# Patient Record
Sex: Female | Born: 1970 | Race: White | Hispanic: No | Marital: Married | Smoking: Never smoker
Health system: Southern US, Community
[De-identification: ages and names within clinical notes are randomized; demographics above are authoritative.]

## PROBLEM LIST (undated history)

## (undated) DIAGNOSIS — Z803 Family history of malignant neoplasm of breast: Secondary | ICD-10-CM

## (undated) DIAGNOSIS — J9801 Acute bronchospasm: Secondary | ICD-10-CM

## (undated) DIAGNOSIS — M1990 Unspecified osteoarthritis, unspecified site: Secondary | ICD-10-CM

## (undated) DIAGNOSIS — F419 Anxiety disorder, unspecified: Secondary | ICD-10-CM

## (undated) DIAGNOSIS — Z8041 Family history of malignant neoplasm of ovary: Secondary | ICD-10-CM

## (undated) DIAGNOSIS — R112 Nausea with vomiting, unspecified: Secondary | ICD-10-CM

## (undated) DIAGNOSIS — G4700 Insomnia, unspecified: Secondary | ICD-10-CM

## (undated) DIAGNOSIS — R87619 Unspecified abnormal cytological findings in specimens from cervix uteri: Secondary | ICD-10-CM

## (undated) DIAGNOSIS — G2581 Restless legs syndrome: Secondary | ICD-10-CM

## (undated) DIAGNOSIS — Z8481 Family history of carrier of genetic disease: Secondary | ICD-10-CM

## (undated) DIAGNOSIS — G4733 Obstructive sleep apnea (adult) (pediatric): Secondary | ICD-10-CM

## (undated) DIAGNOSIS — J45909 Unspecified asthma, uncomplicated: Secondary | ICD-10-CM

## (undated) HISTORY — DX: Unspecified osteoarthritis, unspecified site: M19.90

## (undated) HISTORY — DX: Family history of malignant neoplasm of breast: Z80.3

## (undated) HISTORY — DX: Family history of carrier of genetic disease: Z84.81

## (undated) HISTORY — DX: Anxiety disorder, unspecified: F41.9

## (undated) HISTORY — PX: WISDOM TOOTH EXTRACTION: SHX21

## (undated) HISTORY — DX: Unspecified abnormal cytological findings in specimens from cervix uteri: R87.619

## (undated) HISTORY — DX: Family history of malignant neoplasm of ovary: Z80.41

## (undated) HISTORY — DX: Acute bronchospasm: J98.01

---
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
## 2012-06-10 ENCOUNTER — Other Ambulatory Visit: Payer: Self-pay | Admitting: Family Medicine

## 2012-06-10 DIAGNOSIS — Z1231 Encounter for screening mammogram for malignant neoplasm of breast: Secondary | ICD-10-CM

## 2012-07-07 ENCOUNTER — Ambulatory Visit
Admission: RE | Admit: 2012-07-07 | Discharge: 2012-07-07 | Disposition: A | Discharge status: Home / Self Care | Payer: BC Managed Care – PPO | Source: Ambulatory Visit | Attending: Family Medicine | Admitting: Family Medicine

## 2012-07-07 DIAGNOSIS — Z1231 Encounter for screening mammogram for malignant neoplasm of breast: Secondary | ICD-10-CM

## 2012-07-21 ENCOUNTER — Other Ambulatory Visit (HOSPITAL_COMMUNITY)
Admission: RE | Admit: 2012-07-21 | Discharge: 2012-07-21 | Disposition: A | Discharge status: Home / Self Care | Payer: BC Managed Care – PPO | Source: Ambulatory Visit | Attending: Family Medicine | Admitting: Family Medicine

## 2012-07-21 ENCOUNTER — Other Ambulatory Visit: Payer: Self-pay | Admitting: Family Medicine

## 2012-07-21 DIAGNOSIS — Z124 Encounter for screening for malignant neoplasm of cervix: Secondary | ICD-10-CM | POA: Insufficient documentation

## 2012-07-21 DIAGNOSIS — Z1151 Encounter for screening for human papillomavirus (HPV): Secondary | ICD-10-CM | POA: Insufficient documentation

## 2013-06-03 ENCOUNTER — Other Ambulatory Visit: Payer: Self-pay

## 2013-06-03 DIAGNOSIS — Z1231 Encounter for screening mammogram for malignant neoplasm of breast: Secondary | ICD-10-CM

## 2013-07-09 ENCOUNTER — Encounter: Payer: Self-pay | Admitting: *Deleted

## 2013-07-10 ENCOUNTER — Ambulatory Visit (INDEPENDENT_AMBULATORY_CARE_PROVIDER_SITE_OTHER): Payer: 59 | Admitting: Gynecology

## 2013-07-10 ENCOUNTER — Ambulatory Visit: Payer: BC Managed Care – PPO

## 2013-07-10 ENCOUNTER — Encounter: Payer: Self-pay | Admitting: Gynecology

## 2013-07-10 VITALS — BP 96/54 | HR 64 | Resp 12 | Ht 67.75 in | Wt 142.2 lb

## 2013-07-10 DIAGNOSIS — F5101 Primary insomnia: Secondary | ICD-10-CM | POA: Insufficient documentation

## 2013-07-10 DIAGNOSIS — G4700 Insomnia, unspecified: Secondary | ICD-10-CM

## 2013-07-10 DIAGNOSIS — Z01419 Encounter for gynecological examination (general) (routine) without abnormal findings: Secondary | ICD-10-CM

## 2013-07-10 NOTE — Progress Notes (Signed)
42 y.o. married Caucasian female   G0P0 here for annual exam. Pt is currently sexually active.    Patient's last menstrual period was 07/01/2013.          Sexually active: yes  The current method of family planning is vasectomy.    Exercising: yes  swim, cycle and run Last pap: 05/2012 normal, neg HRHPV Alcohol: 1x weekly  Tobacco: no BSE: sometimes Mammogram: due 8/7    Health Maintenance  Topic Date Due  . Tetanus/tdap  11/20/1990  . Influenza Vaccine  08/24/2013  . Pap Smear  07/22/2015    Family History  Problem Relation Age of Onset  . Breast cancer Maternal Grandmother   . Cancer Maternal Grandfather     lung cancer    There are no active problems to display for this patient.   Past Medical History  Diagnosis Date  . Anxiety     Past Surgical History  Procedure Laterality Date  . Wisdom tooth extraction      Allergies: Review of patient's allergies indicates no known allergies.  Current Outpatient Prescriptions  Medication Sig Dispense Refill  . clonazePAM (KLONOPIN) 2 MG tablet Take 2 mg by mouth 2 (two) times daily as needed for anxiety.       No current facility-administered medications for this visit.    ROS: Pertinent items are noted in HPI.  Exam:    BP 96/54  Pulse 64  Resp 12  Ht 5' 7.75" (1.721 m)  Wt 142 lb 3.2 oz (64.501 kg)  BMI 21.78 kg/m2  LMP 07/01/2013 Weight change: @WEIGHTCHANGE @ Last 3 height recordings:  Ht Readings from Last 3 Encounters:  07/10/13 5' 7.75" (1.721 m)   General appearance: alert, cooperative and appears stated age Head: Normocephalic, without obvious abnormality, atraumatic Neck: no adenopathy, no carotid bruit, no JVD, supple, symmetrical, trachea midline and thyroid not enlarged, symmetric, no tenderness/mass/nodules Lungs: clear to auscultation bilaterally Breasts: normal appearance, no masses or tenderness Heart: regular rate and rhythm, S1, S2 normal, no murmur, click, rub or gallop Abdomen: soft,  non-tender; bowel sounds normal; no masses,  no organomegaly Extremities: extremities normal, atraumatic, no cyanosis or edema Skin: Skin color, texture, turgor normal. No rashes or lesions Lymph nodes: Cervical, supraclavicular, and axillary nodes normal. no inguinal nodes palpated Neurologic: Grossly normal   Pelvic: External genitalia:  no lesions              Urethra: normal appearing urethra with no masses, tenderness or lesions              Bartholins and Skenes: normal                 Vagina: normal appearing vagina with normal color and discharge, no lesions              Cervix: normal appearance              Pap taken: no        Bimanual Exam:  Uterus:  uterus is normal size, shape, consistency and nontender                                      Adnexa:    normal adnexa in size, nontender and no masses  Rectovaginal: Confirms                                      Anus:  normal sphincter tone, no lesions  A: well woman     P: mammogram due pap smear guidelines reviewed, due 2018 return annually or prn   An After Visit Summary was printed and given to the patient.

## 2013-07-10 NOTE — Patient Instructions (Signed)

## 2013-07-30 ENCOUNTER — Ambulatory Visit
Admission: RE | Admit: 2013-07-30 | Discharge: 2013-07-30 | Disposition: A | Discharge status: Home / Self Care | Payer: 59 | Source: Ambulatory Visit

## 2013-07-30 DIAGNOSIS — Z1231 Encounter for screening mammogram for malignant neoplasm of breast: Secondary | ICD-10-CM

## 2014-04-19 ENCOUNTER — Other Ambulatory Visit: Payer: Self-pay

## 2014-04-19 DIAGNOSIS — Z1231 Encounter for screening mammogram for malignant neoplasm of breast: Secondary | ICD-10-CM

## 2014-07-16 ENCOUNTER — Encounter: Payer: Self-pay | Admitting: Gynecology

## 2014-07-16 ENCOUNTER — Ambulatory Visit (INDEPENDENT_AMBULATORY_CARE_PROVIDER_SITE_OTHER): Payer: 59 | Admitting: Gynecology

## 2014-07-16 VITALS — BP 102/60 | HR 70 | Resp 16 | Ht 68.0 in | Wt 141.0 lb

## 2014-07-16 DIAGNOSIS — Z01419 Encounter for gynecological examination (general) (routine) without abnormal findings: Secondary | ICD-10-CM

## 2014-07-16 DIAGNOSIS — Z3041 Encounter for surveillance of contraceptive pills: Secondary | ICD-10-CM

## 2014-07-16 DIAGNOSIS — Z113 Encounter for screening for infections with a predominantly sexual mode of transmission: Secondary | ICD-10-CM

## 2014-07-16 MED ORDER — ETONOGESTREL-ETHINYL ESTRADIOL 0.12-0.015 MG/24HR VA RING: VAGINAL_RING | VAGINAL | Status: DC

## 2014-07-16 NOTE — Progress Notes (Signed)
43 y.o. Separated Caucasian female   G0P0 here for annual exam. Pt is currently sexually active.  2 recent partners since separation, +/- condom use, interested in STD screening.  Pt happy with separation.  Does several tri-athelon's a year.  Patient's last menstrual period was 06/10/2014.          Sexually active: Yes.    The current method of family planning is OCP (estrogen/progesterone).    Exercising: Yes.    Cycling 2/wk, running 1x/wk, swimming 1x/wkk Last pap: 06/23/12 NEG HR HPV Alcohol: Maybe 1 drink/wk Tobacco: no BSE:  No Mammogram: 07/30/13 Bi-Rads 1   Labs: Satira Mccallum    Health Maintenance  Topic Date Due  . Tetanus/tdap  11/20/1990  . Influenza Vaccine  07/24/2014  . Pap Smear  07/26/2015    Family History  Problem Relation Age of Onset  . Breast cancer Maternal Grandmother   . Cancer Maternal Grandfather     lung cancer    Patient Active Problem List   Diagnosis Date Noted  . Insomnia 07/10/2013    Past Medical History  Diagnosis Date  . Anxiety     Past Surgical History  Procedure Laterality Date  . Wisdom tooth extraction      Allergies: Review of patient's allergies indicates no known allergies.  Current Outpatient Prescriptions  Medication Sig Dispense Refill  . clonazePAM (KLONOPIN) 2 MG tablet Take 2 mg by mouth 2 (two) times daily as needed for anxiety.      [employer] Mast Triphasic (ORTHO-NOVUM 7/7/7, 28, PO) Take by mouth.       No current facility-administered medications for this visit.    ROS: Pertinent items are noted in HPI.  Exam:    BP 102/60  Pulse 70  Resp 16  Ht 5\' 8"  (1.727 m)  Wt 141 lb (63.957 kg)  BMI 21.44 kg/m2  LMP 06/10/2014 Weight change: @WEIGHTCHANGE @ Last 3 height recordings:  Ht Readings from Last 3 Encounters:  07/16/14 5\' 8"  (1.727 m)  07/10/13 5' 7.75" (1.721 m)   General appearance: alert, cooperative and appears stated age Head: Normocephalic, without obvious abnormality,  atraumatic Neck: no adenopathy, no carotid bruit, no JVD, supple, symmetrical, trachea midline and thyroid not enlarged, symmetric, no tenderness/mass/nodules Lungs: clear to auscultation bilaterally Breasts: normal appearance, no masses or tenderness Heart: regular rate and rhythm, S1, S2 normal, no murmur, click, rub or gallop Abdomen: soft, non-tender; bowel sounds normal; no masses,  no organomegaly Extremities: extremities normal, atraumatic, no cyanosis or edema Skin: Skin color, texture, turgor normal. No rashes or lesions Lymph nodes: Cervical, supraclavicular, and axillary nodes normal. no inguinal nodes palpated Neurologic: Grossly normal   Pelvic: External genitalia:  normal escutcheon              Urethra: normal appearing urethra with no masses, tenderness or lesions              Bartholins and Skenes: Bartholin's, Urethra, Skene's normal                 Vagina: normal appearing vagina with normal color and discharge, no lesions              Cervix: normal appearance              Pap taken: No.        Bimanual Exam:  Uterus:  uterus is normal size, shape, consistency and nontender  Adnexa:    normal adnexa in size, nontender and no masses                                      Rectovaginal: Confirms                                      Anus:  normal sphincter tone, no lesions     1. Routine gynecological examination  counseled on breast self exam, mammography screening, use and side effects of OCP's, adequate intake of calcium and vitamin D, diet and exercise, condoms stressed return annually or prn  2. Screen for STD (sexually transmitted disease)  - N. gonorrhoea and Chlamydia by PCR (IPS) - STD Panel (HBSAG,HIV,RPR)  3. Encounter for surveillance of contraceptive pills  - etonogestrel-ethinyl estradiol (NUVARING) 0.12-0.015 MG/24HR vaginal ring; Insert vaginally and leave in place for 4 consecutive weeks, then remove for 3d   Dispense: 3 each; Refill: 3  An After Visit Summary was printed and given to the patient.

## 2014-07-16 NOTE — Patient Instructions (Signed)

## 2014-07-17 LAB — STD PANEL
HIV: NONREACTIVE
Hepatitis B Surface Ag: NEGATIVE

## 2014-07-20 LAB — IPS N GONORRHOEA AND CHLAMYDIA BY PCR

## 2014-08-02 ENCOUNTER — Ambulatory Visit
Admission: RE | Admit: 2014-08-02 | Discharge: 2014-08-02 | Disposition: A | Discharge status: Home / Self Care | Payer: 59 | Source: Ambulatory Visit

## 2014-08-02 DIAGNOSIS — Z1231 Encounter for screening mammogram for malignant neoplasm of breast: Secondary | ICD-10-CM

## 2014-08-10 ENCOUNTER — Telehealth: Payer: Self-pay | Admitting: Gynecology

## 2014-08-10 NOTE — Telephone Encounter (Signed)
Patient calling to speak with nurse about Nuvaring questions.

## 2014-08-10 NOTE — Telephone Encounter (Signed)
Spoke with patient. Patient states that Saturday morning she went for a long bike ride and when she returned she began to have vaginal discharge and itching. Patient thought it was from riding her bike so long but symptoms have not gone away. Patient states that she has a vaginal odor as well. Patient read that this could be a side effect of the Nuvaring. Patient received a text from recent partner who has a rash on his thighs and testicles. Patient would like to know if this could be related. Advised patient that he will need to be seen with his doctor to be checked out for rash. Advised does not sound related but can not be sure. Advised best for patient to come in to see provider for evaluation to see what needs to be done for patient. Patient agreeable. Agreeable to see another provider as Dr.Lathrop does not have any openings for this week.Appointment scheduled for tomorrow at 9:15am with Verner Chol CNM. Patient agreeable to date and time.   Cc: Verner Chol CNM   Routing to provider for final review. Patient agreeable to disposition. Will close encounter

## 2014-08-11 ENCOUNTER — Ambulatory Visit (INDEPENDENT_AMBULATORY_CARE_PROVIDER_SITE_OTHER): Payer: 59 | Admitting: Certified Nurse Midwife

## 2014-08-11 ENCOUNTER — Encounter: Payer: Self-pay | Admitting: Certified Nurse Midwife

## 2014-08-11 VITALS — BP 110/70 | HR 68 | Resp 16 | Ht 68.0 in | Wt 141.0 lb

## 2014-08-11 DIAGNOSIS — B3731 Acute candidiasis of vulva and vagina: Secondary | ICD-10-CM

## 2014-08-11 DIAGNOSIS — B373 Candidiasis of vulva and vagina: Secondary | ICD-10-CM

## 2014-08-11 MED ORDER — TERCONAZOLE 0.4 % VA CREA: 1.0000 | TOPICAL_CREAM | Freq: Every day | VAGINAL | Status: DC

## 2014-08-11 NOTE — Progress Notes (Signed)
43 y.o. separated white female g0p0 here with complaint of vaginal symptoms of itching, burning, and increase discharge. Describes discharge as white thick discharge. Onset of symptoms 5 days ago. 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 Partner diagnosed with yeast infection external(female). Contraception Nuvaring. No STD concerns. Urinary symptoms none. Marland Kitchen   O:Healthy female WDWN Affect: normal, orientation x 3  Exam: Abdomen:soft, non tender Lymph node: no enlargement or tenderness Pelvic exam: External genital:  BUS: negative Vagina: white thick no odorous discharge noted. Ph:4.0   ,Wet prep taken Cervix: normal, non tender Uterus: normal, non tender Adnexa:normal, non tender, no masses or fullness noted   Wet Prep results:positive for yeast   A:Yeast Vaginitis   P:Discussed findings of Yeast vaginitis and etiology. Discussed Aveeno or baking soda sitz bath for comfort. Avoid moist clothes or pads for extended period of time. If working out in gym clothes or swim suits for long periods of time change underwear or bottoms of swimsuit if possible. Olive Oil use for skin protection prior to activity can be used to external skin. Rx: Terazol 7 with instructions  Rv prn

## 2014-08-11 NOTE — Progress Notes (Signed)
Reviewed personally.  M. Suzanne Tirrell Buchberger, MD.  

## 2014-08-11 NOTE — Patient Instructions (Signed)

## 2014-10-27 ENCOUNTER — Telehealth: Payer: Self-pay | Admitting: Certified Nurse Midwife

## 2014-10-27 ENCOUNTER — Encounter: Payer: Self-pay | Admitting: Certified Nurse Midwife

## 2014-10-27 ENCOUNTER — Ambulatory Visit (INDEPENDENT_AMBULATORY_CARE_PROVIDER_SITE_OTHER): Payer: 59 | Admitting: Certified Nurse Midwife

## 2014-10-27 VITALS — BP 110/68 | HR 70 | Resp 16 | Ht 68.0 in | Wt 146.0 lb

## 2014-10-27 DIAGNOSIS — B373 Candidiasis of vulva and vagina: Secondary | ICD-10-CM

## 2014-10-27 DIAGNOSIS — B3731 Acute candidiasis of vulva and vagina: Secondary | ICD-10-CM

## 2014-10-27 MED ORDER — NYSTATIN 100000 UNIT/GM EX CREA: 1.0000 "application " | TOPICAL_CREAM | Freq: Two times a day (BID) | CUTANEOUS | Status: DC

## 2014-10-27 NOTE — Patient Instructions (Signed)

## 2014-10-27 NOTE — Progress Notes (Signed)
Reviewed personally.  M. Suzanne Anyelo Mccue, MD.  

## 2014-10-27 NOTE — Telephone Encounter (Signed)
Patient calling because she forgot the name of the OTC medication Debbi told her to buy to mix with the RX prescribed today.

## 2014-10-27 NOTE — Telephone Encounter (Signed)
Spoke with patient. Advised patient that I spoke with Verner Chol CNM and she recommends that patient mix 0.5 % hydrocortisone cream with her prescription for mycostatin. Patient is agreeable and verbalizes understanding.  Routing to provider for final review. Patient agreeable to disposition. Will close encounter

## 2014-10-27 NOTE — Progress Notes (Signed)
42 y.o.separated white female g0p0 here with complaint of vaginal symptoms of external itching, no change in vaginal discharge. Patient is a runner and cyclist, but none excessive recently. .Onset of symptoms 2 weeks ago. Denies new personal products or vaginal dryness. Also noted Nuvaring felt like it was not in place. No STD concerns. Urinary symptoms none. Marland Kitchen   O:Healthy female WDWN Affect: normal, orientation x 3  Exam: Abdomen:soft non tender Lymph node: no enlargement or tenderness Pelvic exam: External genital: red, slightly tender, white exudate present, no lesions wet prep taken BUS: negative Vagina: normal discharge noted. Ph:4.0   ,Wet prep taken, Nuvaring noted in place, with cervix posterior to Nuvaring, Patient shown vagina area and palpated cervix and felt reassured Cervix: normal, non tender Uterus: normal, non tender Adnexa:normal, non tender, no masses or fullness noted   Wet Prep results: Yeast external only   A:Yeast vulvitis   P:Discussed findings of Yeast Vulvitis and etiology. Discussed Aveeno or baking soda sitz bath for comfort. Avoid moist clothes or pads for extended period of time. If working out in gym clothes  for long periods of time change underwear or bottoms of work out clothes if become very moist to help prevent reoccurrence. Olive Oil or coconut oil  use for skin protection prior to activity can be used to external skin. Questions addressed.  Rx: Nystatin cream see order with instructions Obtain OTC 1/2 % hydrocortisone cream to use with also for 5 days bid.  Rv prn

## 2014-11-10 ENCOUNTER — Telehealth: Payer: Self-pay | Admitting: Certified Nurse Midwife

## 2014-11-10 NOTE — Telephone Encounter (Signed)
Spoke with patient. Patient states that she was seen with Verner Chol CNM and treated for a yeast infection. Patient was treated with nystatin cream which she mixed with hydrocortisone cream for 7 days. States that symptoms went away until she ran for 2 days and cycled for 1. 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 Patient used nystatin with hydrocortisone cream for one day and coconut oil for one day. "I just want to make sure that is okay." Advised patient this is fine to use with symptoms. Advised if this does not relieve symptoms will need to be seen for OV. Patient states that this provided her relief. Patient would like to know if she can spread what she is experiencing during intercourse. Advised patient if she is just having vaginal redness and external itching I would not be as concerned about it spreading. Advised it may cause more irritation to the area. Advised if she has any vaginal discharge or internal irritation it is best to abstain from intercourse. Advised best to abstain until symptoms have cleared. Patient is agreeable. Advised would send a message to Verner Chol CNM and if she has any further recommendations will return call.  Verner Chol CNM anything further for patient?

## 2014-11-10 NOTE — Telephone Encounter (Signed)
Agree with plan 

## 2014-11-10 NOTE — Telephone Encounter (Signed)
Patient was told to call if the medication she was prescribed recently did not work for her.

## 2014-11-24 ENCOUNTER — Telehealth: Payer: Self-pay | Admitting: Gynecology

## 2014-11-24 ENCOUNTER — Other Ambulatory Visit: Payer: Self-pay

## 2014-11-24 DIAGNOSIS — Z1231 Encounter for screening mammogram for malignant neoplasm of breast: Secondary | ICD-10-CM

## 2014-11-24 NOTE — Telephone Encounter (Signed)
Left message to reschedule aex appointment. °

## 2015-04-26 ENCOUNTER — Other Ambulatory Visit: Payer: Self-pay | Admitting: Certified Nurse Midwife

## 2015-04-26 DIAGNOSIS — Z3041 Encounter for surveillance of contraceptive pills: Secondary | ICD-10-CM

## 2015-04-26 MED ORDER — ETONOGESTREL-ETHINYL ESTRADIOL 0.12-0.015 MG/24HR VA RING: VAGINAL_RING | VAGINAL | Status: DC

## 2015-04-26 NOTE — Telephone Encounter (Signed)
Patient is requesting a refill of birth control, confirmed pharmacy on file.  °

## 2015-04-26 NOTE — Telephone Encounter (Signed)
Medication refill request: Nuvaring  Last AEX:  07/16/14 with TL  Next AEX: 08/04/15 with DL  Last MMG (if hormonal medication request): 08/02/14 Bi-Rads 1: Negative  Refill authorized: #3 ring/0 rfs, please advise  (sent to [facility] since DL is out of office today)

## 2015-04-27 NOTE — Telephone Encounter (Signed)
Patient notified that rx was sent to pharmacy  ?

## 2015-07-18 ENCOUNTER — Ambulatory Visit: Payer: 59 | Admitting: Gynecology

## 2015-07-25 DIAGNOSIS — Z8742 Personal history of other diseases of the female genital tract: Secondary | ICD-10-CM

## 2015-07-25 HISTORY — DX: Personal history of other diseases of the female genital tract: Z87.42

## 2015-07-26 ENCOUNTER — Other Ambulatory Visit: Payer: Self-pay | Admitting: *Deleted

## 2015-07-26 DIAGNOSIS — Z3041 Encounter for surveillance of contraceptive pills: Secondary | ICD-10-CM

## 2015-07-26 MED ORDER — ETONOGESTREL-ETHINYL ESTRADIOL 0.12-0.015 MG/24HR VA RING: VAGINAL_RING | VAGINAL | Status: DC

## 2015-07-26 NOTE — Telephone Encounter (Signed)
Medication refill request: Nuvaring  Last AEX:  07-16-14  Next AEX: 08-04-15 Last MMG (if hormonal medication request): 08-02-14 WNL  Refill authorized: please advise -eh

## 2015-08-04 ENCOUNTER — Encounter: Payer: Self-pay | Admitting: Certified Nurse Midwife

## 2015-08-04 ENCOUNTER — Ambulatory Visit (INDEPENDENT_AMBULATORY_CARE_PROVIDER_SITE_OTHER): Payer: 59 | Admitting: Certified Nurse Midwife

## 2015-08-04 ENCOUNTER — Ambulatory Visit
Admission: RE | Admit: 2015-08-04 | Discharge: 2015-08-04 | Disposition: A | Discharge status: Home / Self Care | Payer: 59 | Source: Ambulatory Visit

## 2015-08-04 VITALS — BP 102/70 | HR 80 | Resp 16 | Ht 67.0 in | Wt 147.0 lb

## 2015-08-04 DIAGNOSIS — Z1231 Encounter for screening mammogram for malignant neoplasm of breast: Secondary | ICD-10-CM

## 2015-08-04 DIAGNOSIS — Z124 Encounter for screening for malignant neoplasm of cervix: Secondary | ICD-10-CM

## 2015-08-04 DIAGNOSIS — N898 Other specified noninflammatory disorders of vagina: Secondary | ICD-10-CM | POA: Diagnosis not present

## 2015-08-04 DIAGNOSIS — Z3041 Encounter for surveillance of contraceptive pills: Secondary | ICD-10-CM

## 2015-08-04 DIAGNOSIS — Z01419 Encounter for gynecological examination (general) (routine) without abnormal findings: Secondary | ICD-10-CM

## 2015-08-04 LAB — WET PREP BY MOLECULAR PROBE
CANDIDA SPECIES: NEGATIVE
Gardnerella vaginalis: NEGATIVE
Trichomonas vaginosis: NEGATIVE

## 2015-08-04 MED ORDER — ETONOGESTREL-ETHINYL ESTRADIOL 0.12-0.015 MG/24HR VA RING: VAGINAL_RING | VAGINAL | Status: DC

## 2015-08-04 NOTE — Progress Notes (Signed)
44 y.o. G0P0 Legally Separated  Caucasian Fe here for annual exam. Patient still working out with swimming, biking,running and weights. Using Nystatin cream prn for itching and yeast, working well. Sexually active, no concerns or STD screening. Completed an iron triathlon this year! Sees PCP aex/labs and medication management. Feels like she is has vaginal irritation again, but no itching or burning or increase in discharge. Feels same as before. No other health concerns today.  Patient's last menstrual period was 07/22/2015.          Sexually active: Yes.    The current method of family planning is NuvaRing vaginal inserts.    Exercising: Yes.    Swim, Bike, run, [employer] Smoker:  no  Health Maintenance: Pap:  06/2012 Neg. HR HPV:Neg MMG:  08/12/14 BIRADS1:neg. Had one today, results pending Self Breast Exam: No Colonoscopy:  Never BMD:   Never TDaP:  < 10 years  Labs: PCP   reports that she has never smoked. She has never used smokeless tobacco. She reports that she drinks about 2.4 oz of alcohol per week. She reports that she does not use illicit drugs.  Past Medical History  Diagnosis Date  . Anxiety     Past Surgical History  Procedure Laterality Date  . Wisdom tooth extraction      Current Outpatient Prescriptions  Medication Sig Dispense Refill  . clonazePAM (KLONOPIN) 2 MG tablet Take 2 mg by mouth 2 (two) times daily as needed for anxiety.    Marland Kitchen etonogestrel-ethinyl estradiol (NUVARING) 0.12-0.015 MG/24HR vaginal ring Insert vaginally and leave in place for 4 consecutive weeks, then remove for 3d 1 each 0  . nystatin cream (MYCOSTATIN) Apply 1 application topically 2 (two) times daily. Apply to affected area BID for up to 7 days. 30 g 0   No current facility-administered medications for this visit.    Family History  Problem Relation Age of Onset  . Breast cancer Maternal Grandmother   . Cancer Maternal Grandfather     lung cancer    ROS:  Pertinent items are noted  in HPI.  Otherwise, a comprehensive ROS was negative.  Exam:   BP 102/70 mmHg  Pulse 80  Resp 16  Ht 5\' 7"  (1.702 m)  Wt 147 lb (66.679 kg)  BMI 23.02 kg/m2  LMP 07/22/2015 Height: 5\' 7"  (170.2 cm) Ht Readings from Last 3 Encounters:  08/04/15 5\' 7"  (1.702 m)  10/27/14 5\' 8"  (1.727 m)  08/11/14 5\' 8"  (1.727 m)    General appearance: alert, cooperative and appears stated age Head: Normocephalic, without obvious abnormality, atraumatic Neck: no adenopathy, supple, symmetrical, trachea midline and thyroid normal to inspection and palpation Lungs: clear to auscultation bilaterally Breasts: normal appearance, no masses or tenderness, No nipple retraction or dimpling, No nipple discharge or bleeding, No axillary or supraclavicular adenopathy Heart: regular rate and rhythm Abdomen: soft, non-tender; no masses,  no organomegaly Extremities: extremities normal, atraumatic, no cyanosis or edema Skin: Skin color, texture, turgor normal. No rashes or lesions Lymph nodes: Cervical, supraclavicular, and axillary nodes normal. No abnormal inguinal nodes palpated Neurologic: Grossly normal   Pelvic: External genitalia:  no lesions              Urethra:  normal appearing urethra with no masses, tenderness or lesions              Bartholin's and Skene's: normal                 Vagina: normal appearing vagina  with normal color and discharge, no lesions, small ? Abrasion at fourchette with slight redness and some redness around fourchette area, no exudate, slightly tender to touch  Affirm taken              Cervix: normal, non tender,no lesions              Pap taken: Yes.   Bimanual Exam:  Uterus:  normal size, contour, position, consistency, mobility, non-tender              Adnexa: normal adnexa and no mass, fullness, tenderness               Rectovaginal: Confirms               Anus:  normal sphincter tone, no lesions  Chaperone present: Yes  A:  Well Woman with normal exam  Contraception  desires Nuvaring  ? Abrasion from bike riding in vaginal fourchette area, vs yeast skin change  P:   Reviewed health and wellness pertinent to exam  Rx Nuvaring see order  Shown area to patient in mirror, she feels this is from biking, will treat if indicated by affirm. Encouraged to apply coconut oil to area for skin protection and advise if does not clear.  Pap smear as above with HPV reflex   counseled on breast self exam, mammography screening( done this am), adequate intake of calcium and vitamin D, diet and exercise  return annually or prn  An After Visit Summary was printed and given to the patient.

## 2015-08-04 NOTE — Progress Notes (Signed)
Reviewed personally.  M. Suzanne Jurline Folger, MD.  

## 2015-08-04 NOTE — Patient Instructions (Signed)

## 2015-08-10 ENCOUNTER — Other Ambulatory Visit: Payer: Self-pay | Admitting: Certified Nurse Midwife

## 2015-08-10 DIAGNOSIS — R87610 Atypical squamous cells of undetermined significance on cytologic smear of cervix (ASC-US): Secondary | ICD-10-CM

## 2015-08-10 DIAGNOSIS — R87810 Cervical high risk human papillomavirus (HPV) DNA test positive: Principal | ICD-10-CM

## 2015-08-10 LAB — IPS PAP TEST WITH REFLEX TO HPV

## 2015-08-12 ENCOUNTER — Telehealth: Payer: Self-pay

## 2015-08-12 NOTE — Telephone Encounter (Signed)
Spoke with patient. Advised of results as seen below. All questions answered. Colposcopy procedure explained to patient. Patient is agreeable and verbalizes understanding. Patient is currently using Nuvaring for birth control. Is due to remove Nuvaring on 8/28 and replace on 9/1. Colposcopy appointment scheduled for 09/01/2015 at 2pm with Verner Chol CNM. Agreeable to date and time.  Instructions given. Motrin 800 mg po x , one hour before appointment with food. Make sure to eat a meal before appointment and drink plenty of fluids. Patient verbalized understanding and will call to reschedule if will be on menses or has any concerns regarding pregnancy. Advised will need to cancel within 24 hours or will have $150.00 late cancellation fee placed to account. Patient agreeable and verbalized understanding of all instructions.   Routing to provider for final review. Patient agreeable to disposition. Will close encounter.   Patient aware provider will review message and nurse will return call if any additional advice or change of disposition.

## 2015-08-12 NOTE — Telephone Encounter (Signed)
-----   Message from Verner Chol, CNM sent at 08/10/2015 12:43 PM EDT ----- Notify patient that pap smear is abnormal with ASCUS and positive HPVHR will need colposcopy evaluation. Order in Please schedule with DL

## 2015-08-18 ENCOUNTER — Telehealth: Payer: Self-pay | Admitting: Certified Nurse Midwife

## 2015-08-18 NOTE — Telephone Encounter (Signed)
Call to patient to review benefits for procedure. Patient agreeable and has appointment already scheduled for 09/01/15.   Routing to provider for final review.   Encounter closed.

## 2015-09-01 ENCOUNTER — Encounter: Payer: Self-pay | Admitting: Certified Nurse Midwife

## 2015-09-01 ENCOUNTER — Ambulatory Visit (INDEPENDENT_AMBULATORY_CARE_PROVIDER_SITE_OTHER): Payer: 59 | Admitting: Certified Nurse Midwife

## 2015-09-01 VITALS — BP 112/74 | HR 72 | Temp 98.7°F | Resp 16 | Ht 67.0 in | Wt 148.0 lb

## 2015-09-01 DIAGNOSIS — R87610 Atypical squamous cells of undetermined significance on cytologic smear of cervix (ASC-US): Secondary | ICD-10-CM | POA: Diagnosis not present

## 2015-09-01 DIAGNOSIS — N390 Urinary tract infection, site not specified: Secondary | ICD-10-CM | POA: Diagnosis not present

## 2015-09-01 DIAGNOSIS — R319 Hematuria, unspecified: Secondary | ICD-10-CM

## 2015-09-01 DIAGNOSIS — R87810 Cervical high risk human papillomavirus (HPV) DNA test positive: Secondary | ICD-10-CM

## 2015-09-01 LAB — POCT URINALYSIS DIPSTICK
BILIRUBIN UA: NEGATIVE
GLUCOSE UA: NEGATIVE
Ketones, UA: NEGATIVE
Nitrite, UA: NEGATIVE
PH UA: 5
Protein, UA: NEGATIVE
Urobilinogen, UA: NEGATIVE

## 2015-09-01 MED ORDER — NITROFURANTOIN MONOHYD MACRO 100 MG PO CAPS: 100.0000 mg | ORAL_CAPSULE | Freq: Two times a day (BID) | ORAL | Status: DC

## 2015-09-01 MED ORDER — PHENAZOPYRIDINE HCL 200 MG PO TABS: 200.0000 mg | ORAL_TABLET | Freq: Three times a day (TID) | ORAL | Status: DC | PRN

## 2015-09-01 NOTE — Progress Notes (Addendum)
Patient ID: Jasmin Duran, female   DOB: 11-15-1971, 44 y.o.   MRN: [id number]  Chief Complaint  Patient presents with  . Colposcopy    //jj    HPI Jasmin Duran is a 44 y.o. g0p0 separated white female.  Here today for colposcopy exam for abnormal pap smear. Denies vaginal bleeding or pelvic pain. Patient also here complaint of urinary frequency, urgency dysuria. Denies back pain, fever, chills or nausea. Does not feel sexual activity related. Onset 3 days ago and has increased in past 24 hours. Request evaluation for.   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 Previous colposcopy: none. Prior cervical treatment: none.  Past Medical History  Diagnosis Date  . Anxiety   . Abnormal Pap smear of cervix     8/16 ASCUS HPV HR +    Past Surgical History  Procedure Laterality Date  . Wisdom tooth extraction      Family History  Problem Relation Age of Onset  . Breast cancer Maternal Grandmother   . Cancer Maternal Grandfather     lung cancer    Social History Social History  Substance Use Topics  . Smoking status: Never Smoker   . Smokeless tobacco: Never Used  . Alcohol Use: 0.6 - 1.2 oz/week    1-2 Standard drinks or equivalent per week    No Known Allergies  Current Outpatient Prescriptions  Medication Sig Dispense Refill  . clonazePAM (KLONOPIN) 2 MG tablet Take 2 mg by mouth daily.     Marland Kitchen etonogestrel-ethinyl estradiol (NUVARING) 0.12-0.015 MG/24HR vaginal ring Insert vaginally and leave in place for 4 consecutive weeks, then remove for 3d 1 each 12  . nystatin cream (MYCOSTATIN) Apply 1 application topically 2 (two) times daily. Apply to affected area BID for up to 7 days. (Patient not taking: Reported on 09/01/2015) 30 g 0   No current facility-administered medications for this visit.    Review of Systems Review of Systems  Constitutional: Negative.   Genitourinary: Positive for dysuria, urgency, frequency and  hematuria. Negative for flank pain, vaginal bleeding, vaginal discharge and vaginal pain.    Blood pressure 112/74, pulse 72, temperature 98.7 F (37.1 C), temperature source Oral, resp. rate 16, height 5\' 7"  (1.702 m), weight 148 lb (67.132 kg), last menstrual period 08/22/2015.  Physical Exam Physical Exam  Constitutional: She is oriented to person, place, and time. She appears well-developed and well-nourished.  Abdominal: She exhibits no distension. There is no tenderness.  Genitourinary: Vagina normal. No vaginal discharge found.    Neurological: She is alert and oriented to person, place, and time.  Skin: Skin is warm and dry.  Psychiatric: She has a normal mood and affect. Her behavior is normal. Judgment and thought content normal.  Negative CVAT bilateral Positive for suprapubic tenderness Urethra tender, Bladder tender, urethral meatus tender Urine 2 + WBC   Data Reviewed Reviewed pap smear results and discussed etiology of HPV. Questions addressed at length.  Assessment   Abnormal pap smear ASCUS + HPVHR here for coloposcopy exam UTI Procedure Details  The risks and benefits of the procedure and Written informed consent obtained.  Speculum placed in vagina and excellent visualization of cervix achieved, cervix swabbed x 3 with saline solution and acetic acid solution. Cervix viewed with 3.75,7.5, 15 # and green filter. Cervical speculum used to view SCJ area. HPV effect noted at 3 o'clock. Lugols applied and non staining noted at 3 o'clock. Cervical biopsy taken at 3  o'clock. ECC obtained. Monsel's applied with no active bleeding noted on removal of speculum. Patient tolerated procedure well. Instructions given.  Specimens: 2 Lab: Urine micro  Complications: none.     Plan    Specimens labelled and sent to Pathology. Patient will be contacted with results when reviewed. Discussed findings consistent with UTI. Reviewed warning signs and need to advise. Increase  water intake and decrease soda, tea and coffee intake. 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 Patient to be notified of results and need for pap smear in one year. Pap recall 08.      Jasmin Duran 09/01/2015, 2:56 PM

## 2015-09-01 NOTE — Progress Notes (Signed)
08-04-15 ASCUS HPV HR + Pt took 400mg  ibuprofen at 1:30 poct urine rbc 1+, wbc 1+

## 2015-09-01 NOTE — Patient Instructions (Signed)

## 2015-09-02 LAB — URINALYSIS, MICROSCOPIC ONLY
BACTERIA UA: NONE SEEN [HPF]
CASTS: NONE SEEN [LPF]
WBC, UA: NONE SEEN WBC/HPF (ref <=5)
YEAST: NONE SEEN [HPF]

## 2015-09-02 NOTE — Progress Notes (Signed)
Reviewed personally.  M. Suzanne Ladale Sherburn, MD.  

## 2015-09-05 LAB — IPS OTHER TISSUE BIOPSY

## 2015-09-06 ENCOUNTER — Telehealth: Payer: Self-pay | Admitting: Emergency Medicine

## 2015-09-06 NOTE — Telephone Encounter (Signed)
-----   Message from Verner Chol, CNM sent at 09/06/2015  7:56 AM EDT ----- Notify patient that cervical biopsy showed LSIL, CIN 1 ECC showed benign endo cervical glands no dysplasia Needs repeat pap one year, very important to have follow up  Pap recall 08

## 2015-09-06 NOTE — Telephone Encounter (Signed)
08 Recall entered. Patient is scheduled for annual exam 08/08/16.  Patient given results as below. Questions answered. Stressed importance of follow up Pap smear in one year. Patient verbalized understanding and confirms appointment for next year as scheduled. Will follow up prn and as scheduled.  Routing to provider for final review. Patient agreeable to disposition. Will close encounter.

## 2015-09-14 ENCOUNTER — Other Ambulatory Visit: Payer: Self-pay | Admitting: Certified Nurse Midwife

## 2015-09-14 NOTE — Telephone Encounter (Signed)
Patient states she was feeling better but started having sx again last night. Sx include frequency and burning with urination. She finished her abx last week.   Please advise.

## 2015-09-14 NOTE — Telephone Encounter (Signed)
Medication refill request: Pyridium  Last AEX:  08/04/15 DL Next AEX: 8/32/54 DL Last MMG (if hormonal medication request): 08/05/15 BIRADS1:Neg Refill authorized: 09/01/15 #[id number]. Today please advise.

## 2015-09-14 NOTE — Telephone Encounter (Signed)
Patient needs call as to status with UTI was treated last week.

## 2015-09-14 NOTE — Telephone Encounter (Addendum)
Patient notified. Advise pt to call back if sx are not better in 2 - 3 days. Patient verbalized understanding.

## 2015-09-14 NOTE — Telephone Encounter (Signed)
Have her increase her water intake. Will renew RX, but this is only for symptoms, can not take more than 3 more days. If still symptoms she needs to advise

## 2015-12-28 ENCOUNTER — Other Ambulatory Visit: Payer: Self-pay

## 2015-12-28 DIAGNOSIS — Z1231 Encounter for screening mammogram for malignant neoplasm of breast: Secondary | ICD-10-CM

## 2016-08-08 ENCOUNTER — Other Ambulatory Visit: Payer: Self-pay | Admitting: Family Medicine

## 2016-08-08 ENCOUNTER — Ambulatory Visit
Admission: RE | Admit: 2016-08-08 | Discharge: 2016-08-08 | Disposition: A | Discharge status: Home / Self Care | Payer: 59 | Source: Ambulatory Visit

## 2016-08-08 ENCOUNTER — Encounter: Payer: Self-pay | Admitting: Certified Nurse Midwife

## 2016-08-08 ENCOUNTER — Ambulatory Visit (INDEPENDENT_AMBULATORY_CARE_PROVIDER_SITE_OTHER): Payer: 59 | Admitting: Certified Nurse Midwife

## 2016-08-08 VITALS — BP 120/70 | HR 70 | Resp 16 | Ht 66.75 in | Wt 146.0 lb

## 2016-08-08 DIAGNOSIS — Z3041 Encounter for surveillance of contraceptive pills: Secondary | ICD-10-CM | POA: Diagnosis not present

## 2016-08-08 DIAGNOSIS — Z124 Encounter for screening for malignant neoplasm of cervix: Secondary | ICD-10-CM | POA: Diagnosis not present

## 2016-08-08 DIAGNOSIS — Z01419 Encounter for gynecological examination (general) (routine) without abnormal findings: Secondary | ICD-10-CM

## 2016-08-08 DIAGNOSIS — Z1231 Encounter for screening mammogram for malignant neoplasm of breast: Secondary | ICD-10-CM

## 2016-08-08 IMAGING — MG DIGITAL SCREENING BILATERAL MAMMOGRAM WITH CAD
4 series · 4 of 4 positions shown · non-contrast
Comparison: Previous exam(s).

CLINICAL DATA: Screening.

EXAM:
DIGITAL SCREENING BILATERAL MAMMOGRAM WITH CAD

[L CC]
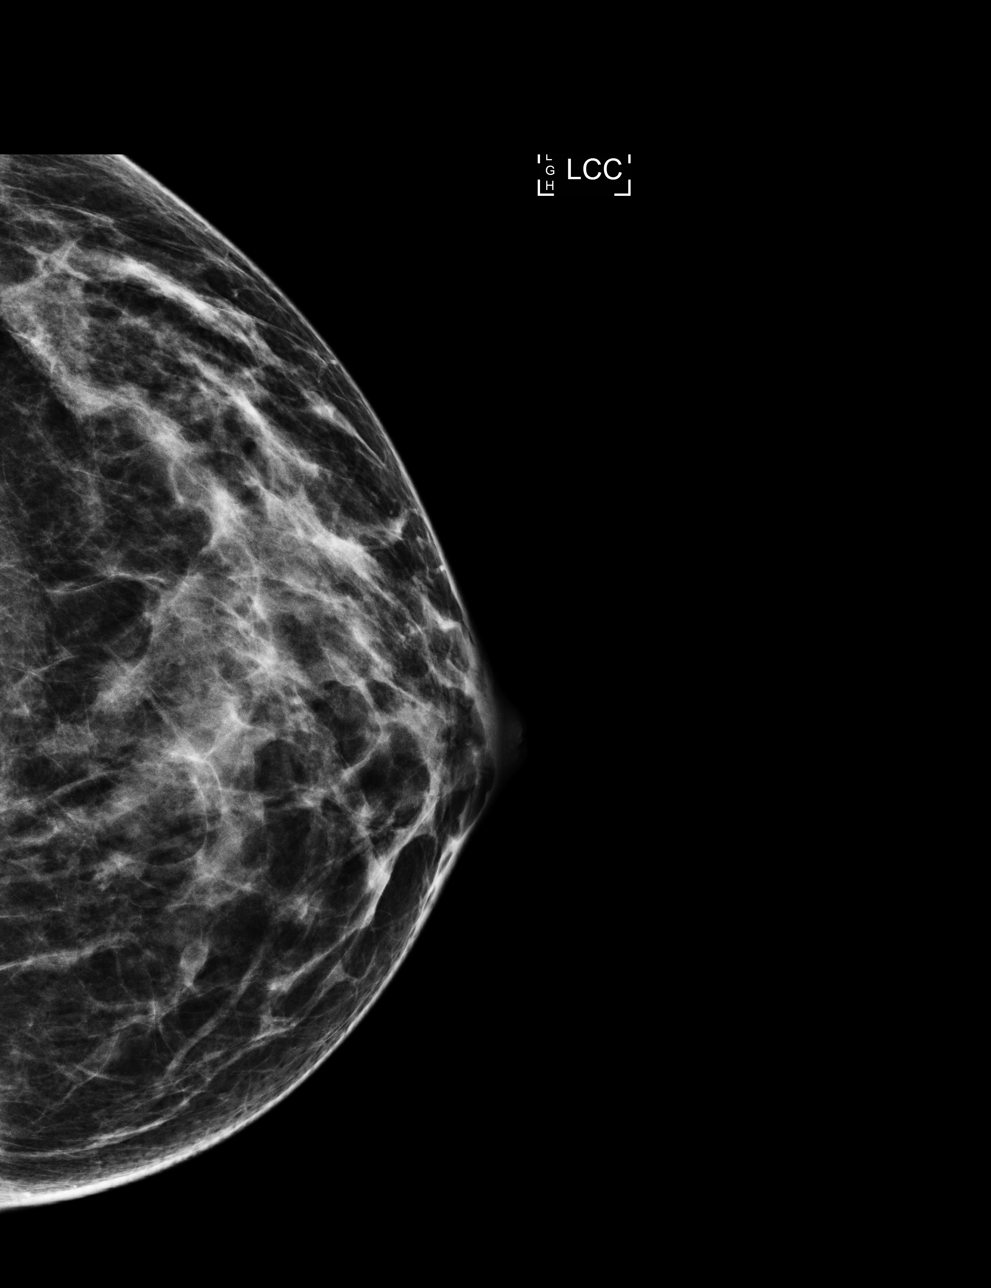

[R MLO]
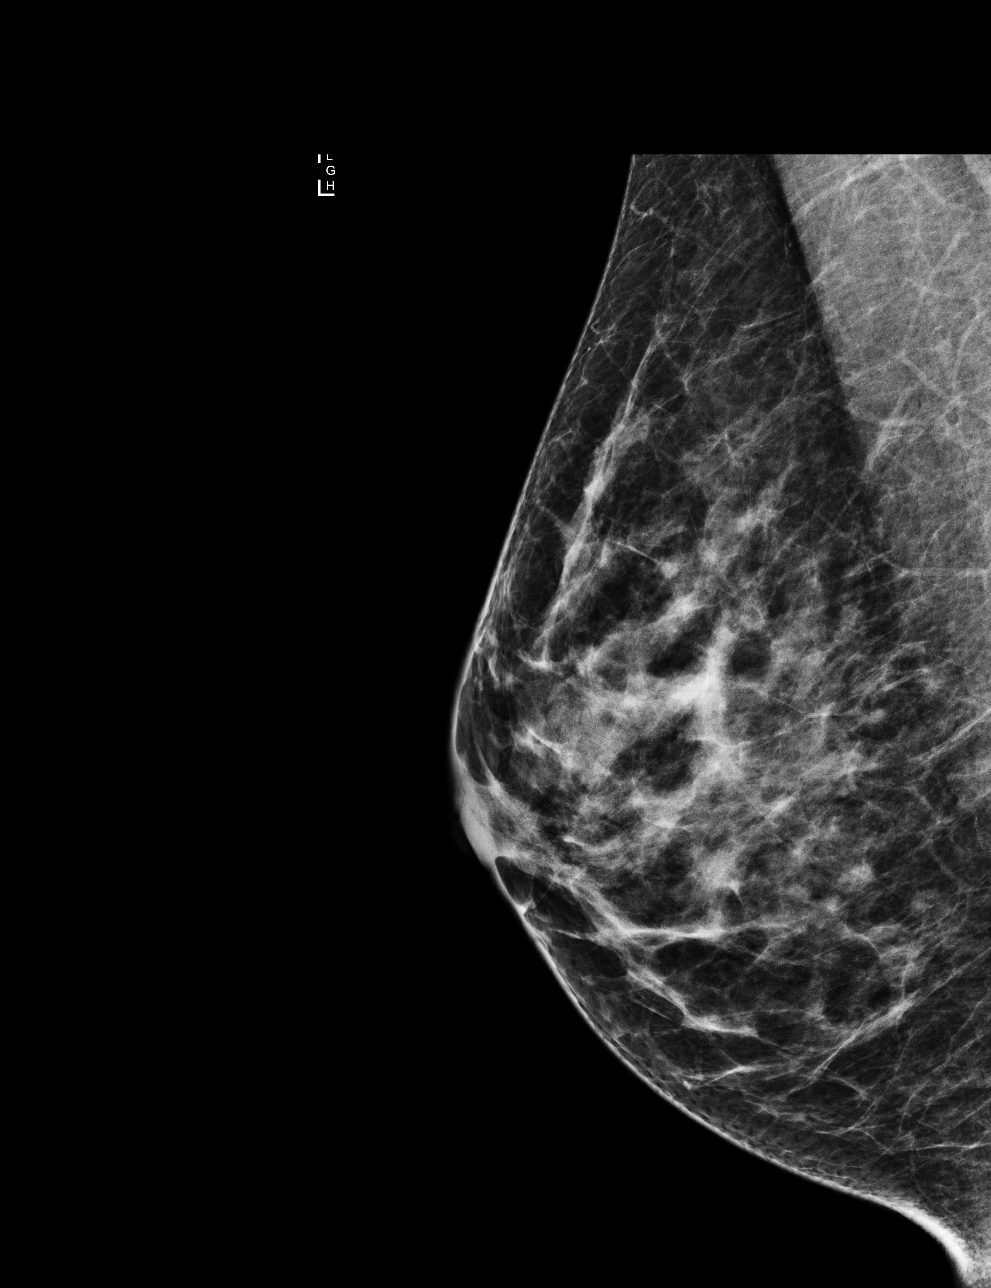

[R CC]
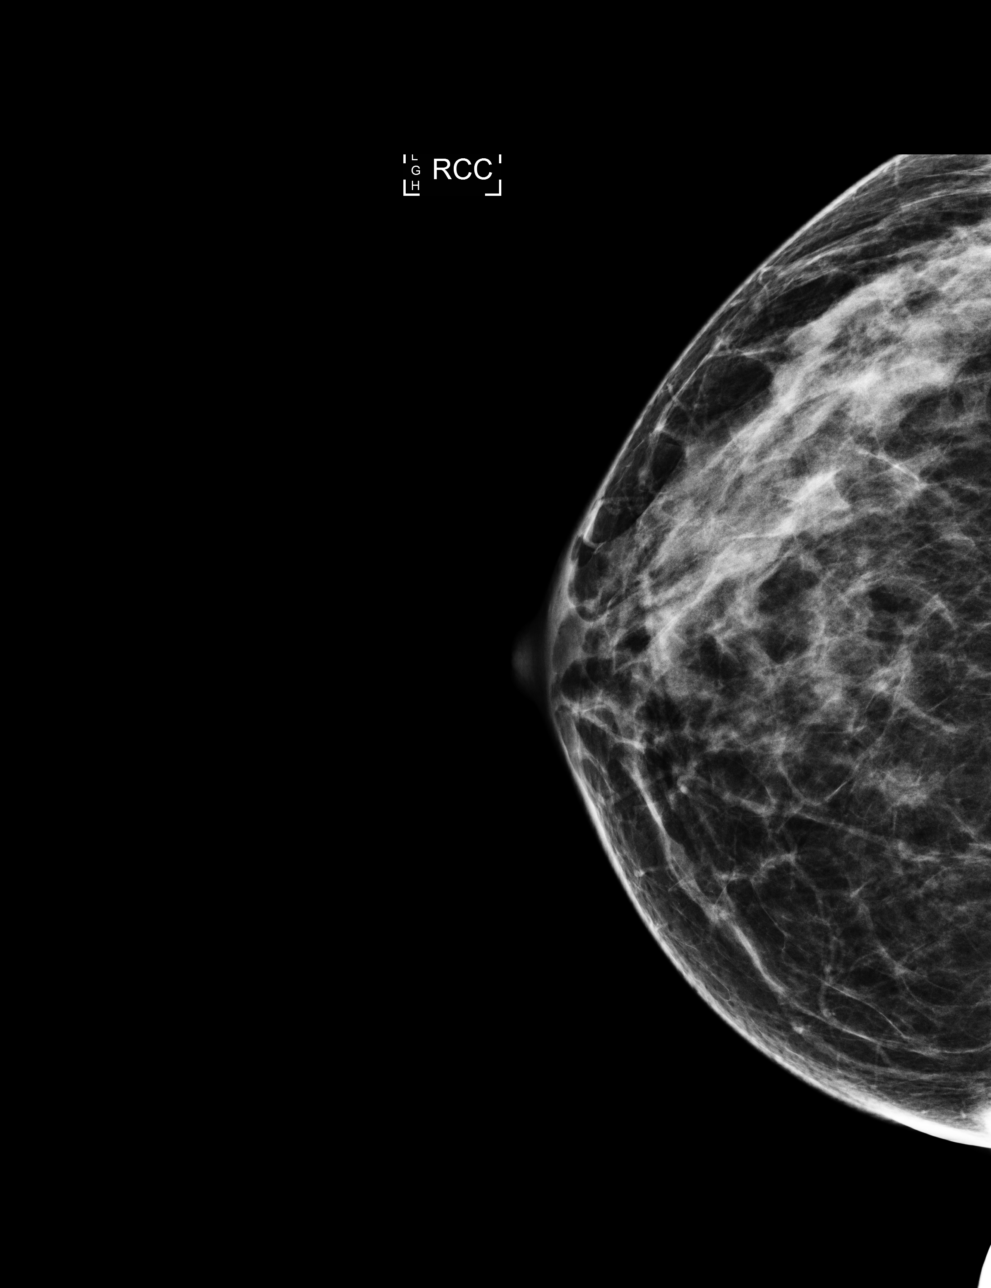

[L MLO]
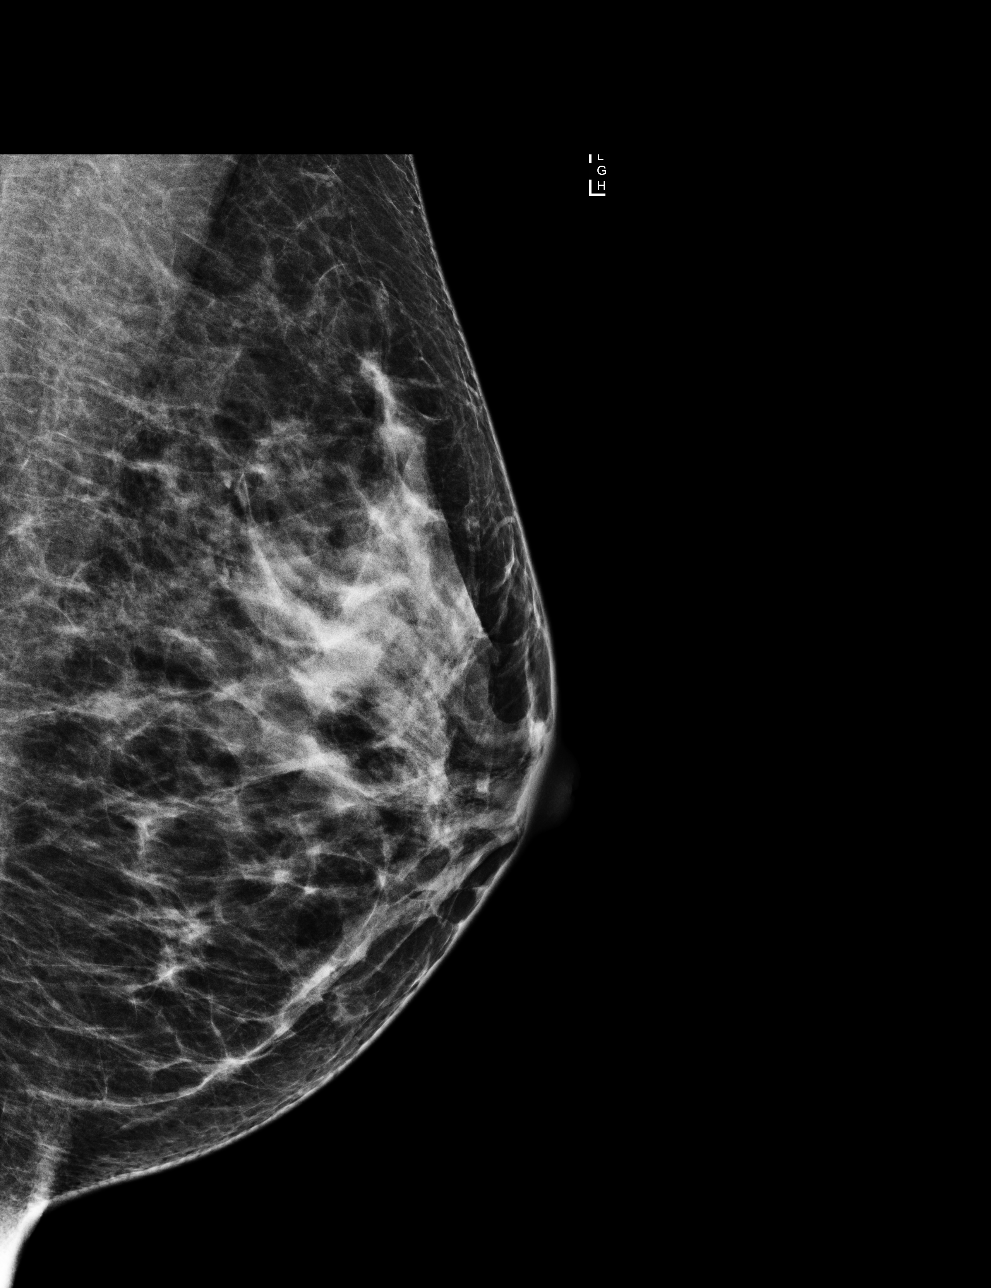

[4 of 4 positions shown; findings below may reference images not displayed]

ACR Breast Density Category c: The breast tissue is heterogeneously
dense, which may obscure small masses.
FINDINGS: There are no findings suspicious for malignancy. Images were
processed with CAD.
IMPRESSION: No mammographic evidence of malignancy. A result letter of this
screening mammogram will be mailed directly to the patient.

RECOMMENDATION:
Screening mammogram in one year. (Code:[0J])

BI-RADS CATEGORY  1: Negative.

## 2016-08-08 MED ORDER — ETONOGESTREL-ETHINYL ESTRADIOL 0.12-0.015 MG/24HR VA RING: VAGINAL_RING | VAGINAL | 12 refills | Status: DC

## 2016-08-08 NOTE — Patient Instructions (Signed)

## 2016-08-08 NOTE — Progress Notes (Signed)
45 y.o. G0P0 Divorced  Caucasian Fe here for annual exam. Periods normal, no issues. Contraception working well. Sexually active, no concerns or testing desired. Had mammogram this am at [facility], [place]. Just  [employer], recently! Sees PCP Dr. Kateri Plummer for aex and labs/medication management of anxiety... No health issues today. Will be cycling 200 miles in the [place] cycling event in [place] this weekend!  Patient's last menstrual period was 07/23/2016 (exact date).          Sexually active: Yes.    The current method of family planning is NuvaRing vaginal inserts.    Exercising: Yes.    cycling, biking,yoga & swimming Smoker:  no  Health Maintenance: Pap:  08-04-15 ASCUS HPV HR +, colpo CIN1 MMG: 08-04-15 category c density birads 1:neg, done this morning Colonoscopy:  none BMD:   none TDaP:  Less than 10 Shingles: no Pneumonia: no Hep C and HIV: HIV neg 2015 Labs: pcp Self breast exam: done occ   reports that she has never smoked. She has never used smokeless tobacco. She reports that she drinks about 0.6 oz of alcohol per week . She reports that she does not use drugs.  Past Medical History:  Diagnosis Date  . Abnormal Pap smear of cervix    8/16 ASCUS HPV HR +  . Anxiety     Past Surgical History:  Procedure Laterality Date  . WISDOM TOOTH EXTRACTION      Current Outpatient Prescriptions  Medication Sig Dispense Refill  . clonazePAM (KLONOPIN) 2 MG tablet Take 2 mg by mouth daily.     Marland Kitchen etonogestrel-ethinyl estradiol (NUVARING) 0.12-0.015 MG/24HR vaginal ring Insert vaginally and leave in place for 4 consecutive weeks, then remove for 3d 1 each 12  . nystatin cream (MYCOSTATIN) Apply 1 application topically 2 (two) times daily. Apply to affected area BID for up to 7 days. (Patient not taking: Reported on 09/01/2015) 30 g 0   No current facility-administered medications for this visit.     Family History  Problem Relation Age of Onset  . Breast cancer Maternal  Grandmother   . Cancer Maternal Grandfather     lung cancer    ROS:  Pertinent items are noted in HPI.  Otherwise, a comprehensive ROS was negative.  Exam:   BP 120/70   Pulse 70   Resp 16   Ht 5' 6.75" (1.695 m)   Wt 146 lb (66.2 kg)   LMP 07/23/2016 (Exact Date)   BMI 23.04 kg/m  Height: 5' 6.75" (169.5 cm) Ht Readings from Last 3 Encounters:  08/08/16 5' 6.75" (1.695 m)  09/01/15 5\' 7"  (1.702 m)  08/04/15 5\' 7"  (1.702 m)    General appearance: alert, cooperative and appears stated age Head: Normocephalic, without obvious abnormality, atraumatic Neck: no adenopathy, supple, symmetrical, trachea midline and thyroid normal to inspection and palpation Lungs: clear to auscultation bilaterally Breasts: normal appearance, no masses or tenderness, No nipple retraction or dimpling, No nipple discharge or bleeding, No axillary or supraclavicular adenopathy Heart: regular rate and rhythm Abdomen: soft, non-tender; no masses,  no organomegaly Extremities: extremities normal, atraumatic, no cyanosis or edema Skin: Skin color, texture, turgor normal. No rashes or lesions Lymph nodes: Cervical, supraclavicular, and axillary nodes normal. No abnormal inguinal nodes palpated Neurologic: Grossly normal   Pelvic: External genitalia:  no lesions              Urethra:  normal appearing urethra with no masses, tenderness or lesions  Bartholin's and Skene's: normal                 Vagina: normal appearing vagina with normal color and discharge, no lesions              Cervix: no cervical motion tenderness, no lesions and normal appearance              Pap taken: Yes.   Bimanual Exam:  Uterus:  normal size, contour, position, consistency, mobility, non-tender              Adnexa: normal adnexa and no mass, fullness, tenderness               Rectovaginal: Confirms               Anus:  normal sphincter tone, no lesions  Chaperone present: yes  A:  Well Woman with normal  exam  Contraception Nuvaring desired  Follow up pap from ASCUS  HPVHR +, colpo CIN 1 today.  P:   Reviewed health and wellness pertinent to exam  Reviewed risks/benefits of Nuvaring, warning signs also.   Rx Nuvaring see order  Pap smear as above with HPVHR   counseled on breast self exam, mammography screening, STD prevention, HIV risk factors and prevention, adequate intake of calcium and vitamin D, diet and exercise  return annually or prn  An After Visit Summary was printed and given to the patient.

## 2016-08-09 ENCOUNTER — Other Ambulatory Visit: Payer: Self-pay | Admitting: Family Medicine

## 2016-08-09 DIAGNOSIS — Z1231 Encounter for screening mammogram for malignant neoplasm of breast: Secondary | ICD-10-CM

## 2016-08-11 NOTE — Progress Notes (Signed)
Encounter reviewed Maxamillian Tienda, MD   

## 2016-08-13 LAB — IPS PAP TEST WITH HPV

## 2017-01-08 ENCOUNTER — Ambulatory Visit (INDEPENDENT_AMBULATORY_CARE_PROVIDER_SITE_OTHER): Payer: 59 | Admitting: Certified Nurse Midwife

## 2017-01-08 ENCOUNTER — Telehealth: Payer: Self-pay | Admitting: Certified Nurse Midwife

## 2017-01-08 ENCOUNTER — Encounter: Payer: Self-pay | Admitting: Certified Nurse Midwife

## 2017-01-08 VITALS — BP 114/66 | HR 72 | Resp 16 | Ht 66.75 in | Wt 143.0 lb

## 2017-01-08 DIAGNOSIS — N951 Menopausal and female climacteric states: Secondary | ICD-10-CM

## 2017-01-08 NOTE — Telephone Encounter (Signed)
Patient calling to discuss switching birth control and estrogen.

## 2017-01-08 NOTE — Telephone Encounter (Signed)
Spoke with patient. Patient states she had a full blood panel work-up completed by her Chiropractor. Patient states estrogen level may be going up and would like to discuss doing more specific testing for estrogen level and possibly adjusting birth control. Recommended OV to further discuss with Leota Sauers, CNM. Patient scheduled for 01/08/17 at 11am. Patient verbalizes understanding and is agreeable to date and time. Last AEX 08/08/16.   Routing to provider for final review. Patient is agreeable to disposition. Will close encounter.

## 2017-01-08 NOTE — Progress Notes (Signed)
Subjective:     Patient ID: Jasmin Duran, female   DOB: October 14, 1971, 46 y.o.   MRN: [id number]  HPI   Patient here to discuss screening estrogen levels. Patient has been doing blood testing with [facility] and in the process was concerned that her estrogen levels were too high due increase perspiration. Here today to request Hormone testing. Periods are light with the exception on the last one which was heavy for one day. Patient had CAT scan for hematuria and was given Iodine and period occurred, slightly early for her. Uses Nuvaring for contraception without problems. Does have night sweats occasionally frequently and takes Klonipen for insomnia. 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 Currently on Tumeric, digestive enzymes, Vitamin A and K2 and Omega 3 per [facility]. She is aware of concerns with fat soluble vitamins and overdosage. Happy with Nuvaring choice. No other health concern today. Here for consult only.  Review of Systems  Constitutional: Negative.   Skin: Negative.   All  Other systems negative except for night sweats, no hot flashes.     Objective:   Physical Exam  WDWN healthy female with normal affect     Assessment:    ? Perimenopausal with symptoms New diagnosis of benign microscopic hematuria with urology Contraception: Nuvaring working well    Plan:     Discussed perimenopausal changes that can occur anytime in the 40's with symptoms related to estrogen decrease. Discussed she is receiving a balance estrogen/progesterone amount with Nuvaring for contraception. This usually helps with perimenopausal symptoms. Screening usually is estrogen, progesterone and FSH. Patient does not want anything but to know her level is not in excess with Nuvaring use. Discussed she has not had any symptoms with increased breast tenderness or acne or skin changes. Questions addressed. Will need to be on off week of Nuvaring and would prefer she be at least greater  than 7 days to have drawn. Patient will call when period starts and schedule lab visit. Given pamphlet on perimenopausal/menopause and expectations. Also discussed bleeding profile changes that can occur.  Lab: Estradiol, Progesterone  Rv prn   20 minutes spent in time with consult regarding estrogen change and perimenopause/menopause.

## 2017-01-10 NOTE — Progress Notes (Signed)
Encounter reviewed Layn Kye, MD   

## 2017-01-28 ENCOUNTER — Other Ambulatory Visit: Payer: 59

## 2017-01-28 DIAGNOSIS — N951 Menopausal and female climacteric states: Secondary | ICD-10-CM

## 2017-01-29 LAB — ESTRADIOL: Estradiol: 23 pg/mL

## 2017-01-31 LAB — 17-HYDROXYPROGESTERONE: 17-OH-Progesterone, LC/MS/MS: <8 ng/dL

## 2017-08-09 ENCOUNTER — Ambulatory Visit
Admission: RE | Admit: 2017-08-09 | Discharge: 2017-08-09 | Disposition: A | Discharge status: Home / Self Care | Payer: 59 | Source: Ambulatory Visit | Attending: Family Medicine | Admitting: Family Medicine

## 2017-08-09 ENCOUNTER — Ambulatory Visit: Payer: 59 | Admitting: Certified Nurse Midwife

## 2017-08-09 DIAGNOSIS — Z1231 Encounter for screening mammogram for malignant neoplasm of breast: Secondary | ICD-10-CM

## 2017-08-09 IMAGING — MG DIGITAL SCREENING BILATERAL MAMMOGRAM WITH CAD
5 series · 5 of 5 positions shown · non-contrast
Comparison: Previous exam(s).

CLINICAL DATA: Screening.

EXAM:
DIGITAL SCREENING BILATERAL MAMMOGRAM WITH CAD

[R XCCL]
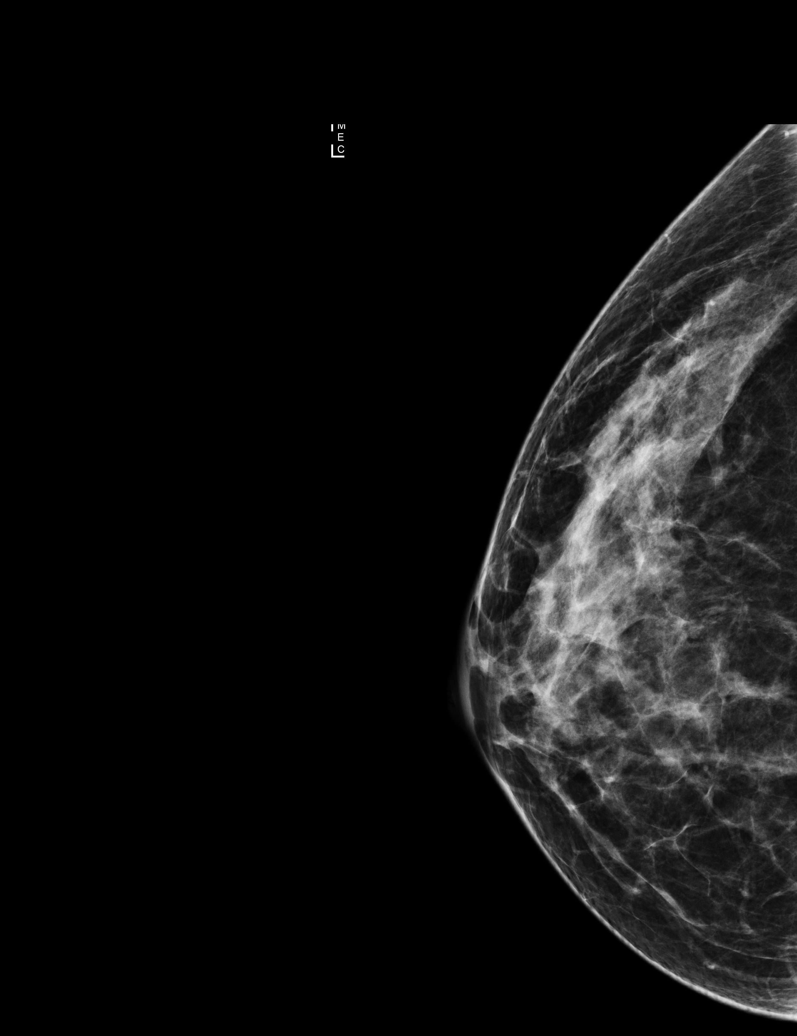

[R CC]
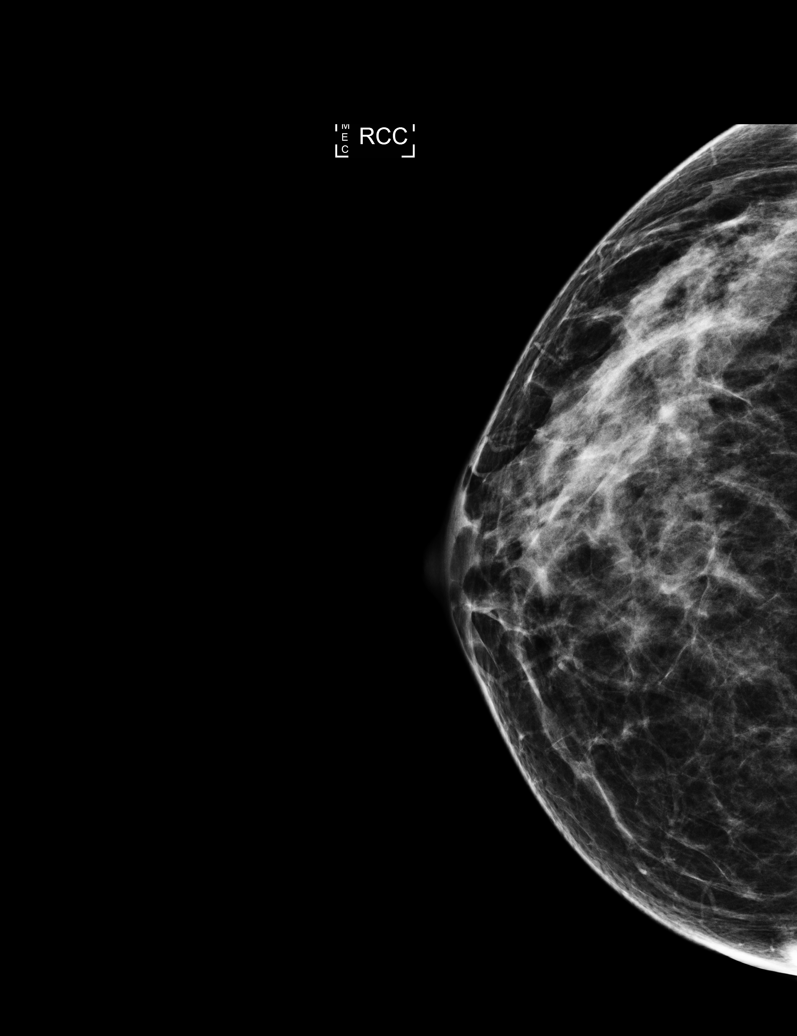

[L MLO]
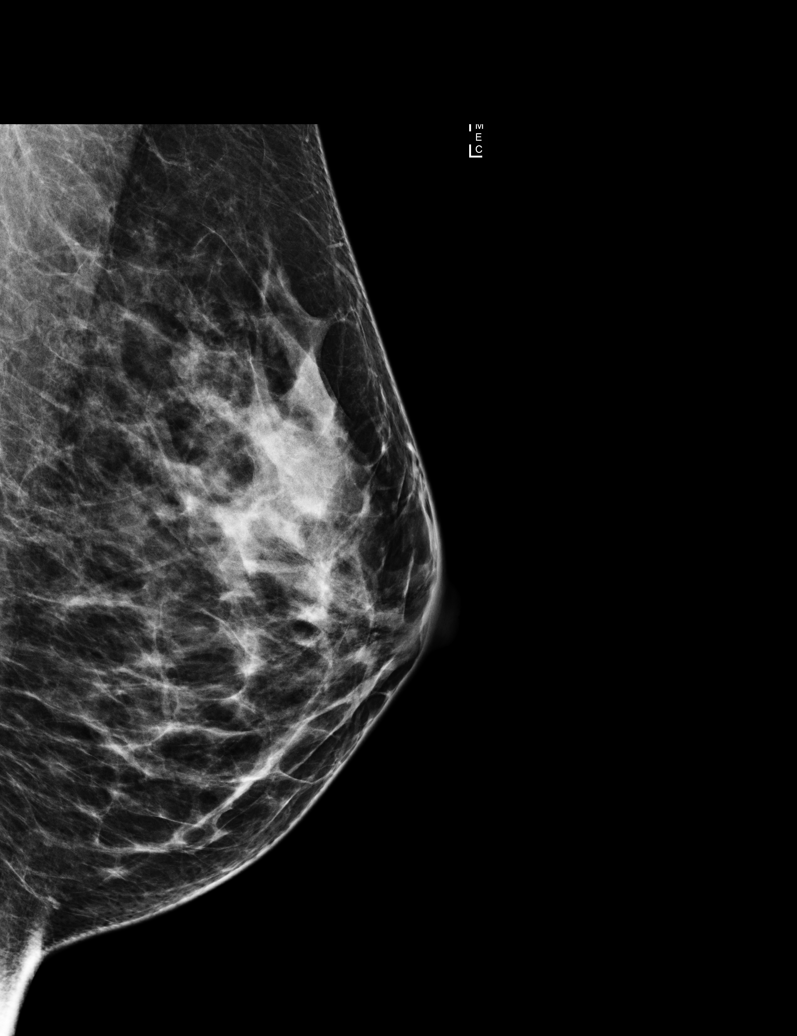

[R MLO]
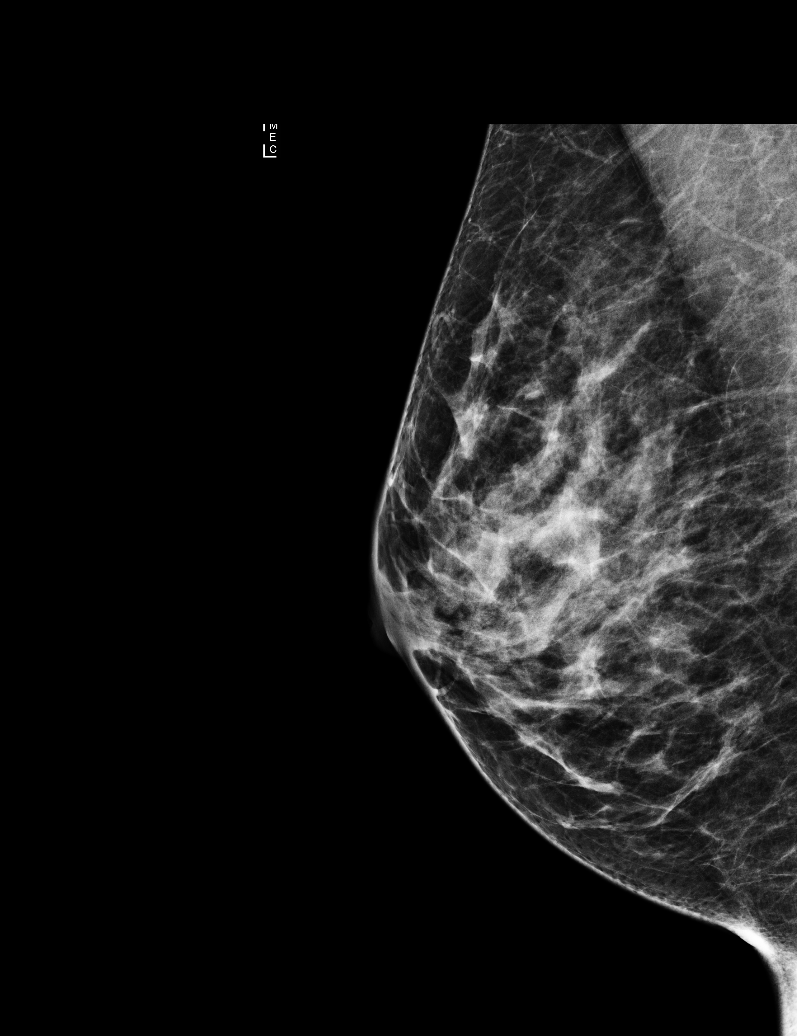

[L CC]
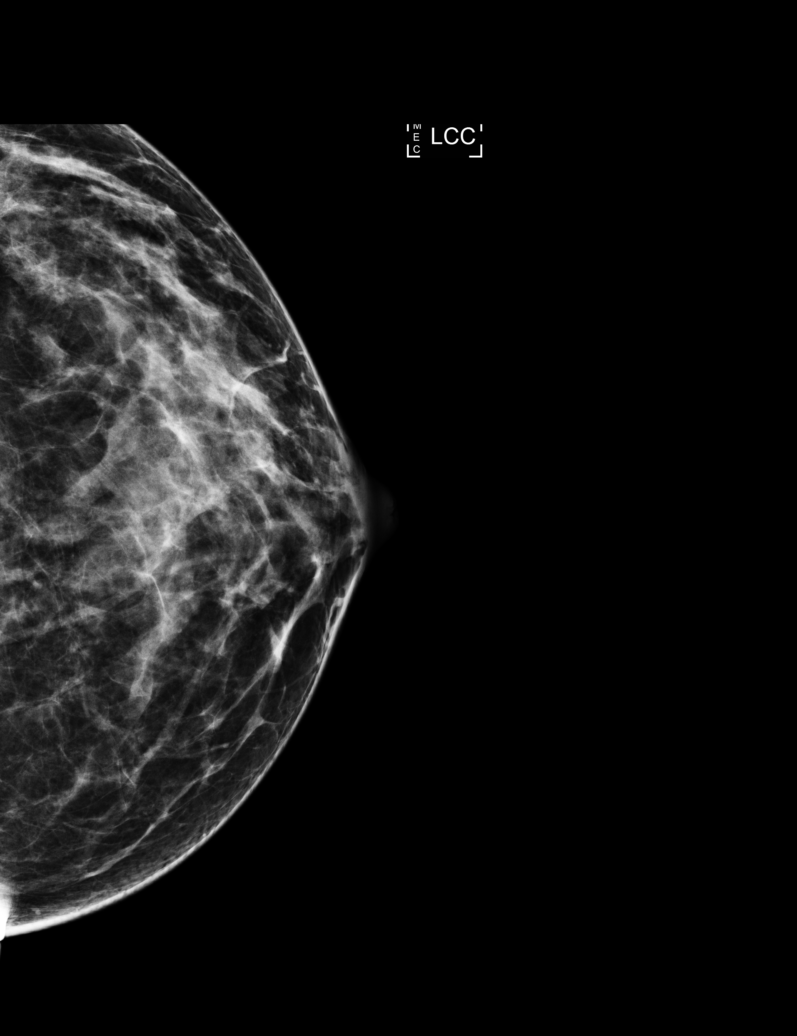

[5 of 5 positions shown; findings below may reference images not displayed]

ACR Breast Density Category c: The breast tissue is heterogeneously
dense, which may obscure small masses.
FINDINGS: There are no findings suspicious for malignancy. Images were
processed with CAD.
IMPRESSION: No mammographic evidence of malignancy. A result letter of this
screening mammogram will be mailed directly to the patient.

RECOMMENDATION:
Screening mammogram in one year. (Code:[0J])

BI-RADS CATEGORY  1: Negative.

## 2017-08-28 ENCOUNTER — Other Ambulatory Visit: Payer: Self-pay | Admitting: Certified Nurse Midwife

## 2017-08-28 DIAGNOSIS — Z3041 Encounter for surveillance of contraceptive pills: Secondary | ICD-10-CM

## 2017-08-28 NOTE — Telephone Encounter (Signed)
Medication refill request: OCP  Last AEX:  08-08-16  Next AEX: 08-30-17  Last MMG (if hormonal medication request): 08-09-17 WNL  Refill authorized: please advise

## 2017-08-30 ENCOUNTER — Other Ambulatory Visit (HOSPITAL_COMMUNITY)
Admission: RE | Admit: 2017-08-30 | Discharge: 2017-08-30 | Disposition: A | Discharge status: Home / Self Care | Payer: 59 | Source: Ambulatory Visit | Attending: Certified Nurse Midwife | Admitting: Certified Nurse Midwife

## 2017-08-30 ENCOUNTER — Encounter: Payer: Self-pay | Admitting: Certified Nurse Midwife

## 2017-08-30 ENCOUNTER — Ambulatory Visit (INDEPENDENT_AMBULATORY_CARE_PROVIDER_SITE_OTHER): Payer: 59 | Admitting: Certified Nurse Midwife

## 2017-08-30 VITALS — BP 122/80 | HR 88 | Resp 16 | Ht 67.25 in | Wt 143.0 lb

## 2017-08-30 DIAGNOSIS — Z124 Encounter for screening for malignant neoplasm of cervix: Secondary | ICD-10-CM | POA: Insufficient documentation

## 2017-08-30 DIAGNOSIS — Z3044 Encounter for surveillance of vaginal ring hormonal contraceptive device: Secondary | ICD-10-CM | POA: Diagnosis not present

## 2017-08-30 DIAGNOSIS — Z01419 Encounter for gynecological examination (general) (routine) without abnormal findings: Secondary | ICD-10-CM | POA: Diagnosis not present

## 2017-08-30 NOTE — Patient Instructions (Signed)

## 2017-08-30 NOTE — Progress Notes (Signed)
46 y.o. G0P0 Divorced  Caucasian Fe here for annual exam. Periods normal, no issues. Nuvaring working well. Recently married and so happy. No health issues. Sees PCP Dr. Kateri Plummer yearly for labs and medication management for anxiety. No health issues today.  Patient's last menstrual period was 08/18/2017.          Sexually active: Yes.    The current method of family planning is NuvaRing vaginal inserts.    Exercising: Yes.    yoga, cycling, running Smoker:  no  Health Maintenance: Pap:  08/08/16 Neg. HR HPV:neg   08/04/15 ASCUS. HR HPV:+Detected  History of Abnormal Pap: yes, Colpo 09/01/15 CIN1 MMG: 08/09/17 BIRADS1:neg  Self Breast exams: yes, occ Colonoscopy: aware of new guidelines for 45, declines BMD:   Never TDaP:  Current  Shingles: No Pneumonia: No Hep C and HIV: Done  Labs: Not today.   reports that she has never smoked. She has never used smokeless tobacco. She reports that she does not drink alcohol or use drugs.  Past Medical History:  Diagnosis Date  . Abnormal Pap smear of cervix    8/16 ASCUS HPV HR +  . Anxiety     Past Surgical History:  Procedure Laterality Date  . WISDOM TOOTH EXTRACTION      Current Outpatient Prescriptions  Medication Sig Dispense Refill  . clonazePAM (KLONOPIN) 1 MG tablet Take 1 tablet by mouth daily.    Marland Kitchen NUVARING 0.12-0.015 MG/24HR vaginal ring INSERT 1 RING VAGINALLY AND LEAVE IN PLACE FOR 4 CONSECUTIVE WEEKS, THEN REMOVE FOR 3 DAYS 1 each 0   No current facility-administered medications for this visit.     Family History  Problem Relation Age of Onset  . Breast cancer Maternal Grandmother   . Cancer Maternal Grandfather        lung cancer    ROS:  Pertinent items are noted in HPI.  Otherwise, a comprehensive ROS was negative.  Exam:   BP 122/80 (BP Location: Right Arm, Patient Position: Sitting, Cuff Size: Normal)   Pulse 88   Resp 16   Ht 5' 7.25" (1.708 m)   Wt 143 lb (64.9 kg)   LMP 08/18/2017   BMI 22.23 kg/m   Height: 5' 7.25" (170.8 cm) Ht Readings from Last 3 Encounters:  08/30/17 5' 7.25" (1.708 m)  01/08/17 5' 6.75" (1.695 m)  08/08/16 5' 6.75" (1.695 m)    General appearance: alert, cooperative and appears stated age Head: Normocephalic, without obvious abnormality, atraumatic Neck: no adenopathy, supple, symmetrical, trachea midline and thyroid normal to inspection and palpation Lungs: clear to auscultation bilaterally Breasts: normal appearance, no masses or tenderness, No nipple retraction or dimpling, No nipple discharge or bleeding, No axillary or supraclavicular adenopathy Heart: regular rate and rhythm Abdomen: soft, non-tender; no masses,  no organomegaly Extremities: extremities normal, atraumatic, no cyanosis or edema Skin: Skin color, texture, turgor normal. No rashes or lesions Lymph nodes: Cervical, supraclavicular, and axillary nodes normal. No abnormal inguinal nodes palpated Neurologic: Grossly normal   Pelvic: External genitalia:  no lesions              Urethra:  normal appearing urethra with no masses, tenderness or lesions              Bartholin's and Skene's: normal                 Vagina: normal appearing vagina with normal color and discharge, no lesions  Cervix: no bleeding following Pap, no cervical motion tenderness and no lesions              Pap taken: Yes.   Bimanual Exam:  Uterus:  normal size, contour, position, consistency, mobility, non-tender              Adnexa: normal adnexa and no mass, fullness, tenderness               Rectovaginal: Confirms               Anus:  normal sphincter tone, no lesions  Chaperone present: yes  A:  Well Woman with normal exam  Contraception Nuvaring desired  MD management of Klonopin  P:   Reviewed health and wellness pertinent to exam  Risks and benefits of Nuvaring discussed, desires continuance  Rx Nuvaring see order with instructions  Pap smear: yes   counseled on breast self exam, mammography  screening, adequate intake of calcium and vitamin D, diet and exercise  return annually or prn  An After Visit Summary was printed and given to the patient.

## 2017-09-03 LAB — CYTOLOGY - PAP
DIAGNOSIS: NEGATIVE
HPV: NOT DETECTED

## 2017-10-19 ENCOUNTER — Other Ambulatory Visit: Payer: Self-pay | Admitting: Certified Nurse Midwife

## 2017-10-19 DIAGNOSIS — Z3041 Encounter for surveillance of contraceptive pills: Secondary | ICD-10-CM

## 2017-10-21 NOTE — Telephone Encounter (Signed)
Medication refill request: NuvaRing Last AEX:  08/30/17 DL Next AEX: not scheduled yet Last MMG (if hormonal medication request): 08/09/17 BIRADS 1 negative/density c Refill authorized: 08/28/17 #1 w/0 refills; today please advise; DL out of office

## 2018-01-07 ENCOUNTER — Encounter: Payer: Self-pay | Admitting: *Deleted

## 2018-01-07 ENCOUNTER — Other Ambulatory Visit: Payer: Self-pay

## 2018-01-07 ENCOUNTER — Emergency Department (INDEPENDENT_AMBULATORY_CARE_PROVIDER_SITE_OTHER): Payer: Managed Care, Other (non HMO)

## 2018-01-07 ENCOUNTER — Emergency Department (INDEPENDENT_AMBULATORY_CARE_PROVIDER_SITE_OTHER)
Admission: EM | Admit: 2018-01-07 | Discharge: 2018-01-07 | Disposition: A | Discharge status: Home / Self Care | Payer: Managed Care, Other (non HMO) | Source: Home / Self Care | Attending: Family Medicine | Admitting: Family Medicine

## 2018-01-07 DIAGNOSIS — R05 Cough: Secondary | ICD-10-CM

## 2018-01-07 DIAGNOSIS — J101 Influenza due to other identified influenza virus with other respiratory manifestations: Secondary | ICD-10-CM

## 2018-01-07 HISTORY — DX: Insomnia, unspecified: G47.00

## 2018-01-07 LAB — POCT INFLUENZA A/B
Influenza A, POC: NEGATIVE
Influenza B, POC: NEGATIVE

## 2018-01-07 IMAGING — DX DG CHEST 2V
2 series · 2 of 2 positions shown · non-contrast
Comparison: None.

CLINICAL DATA: Persistent cough for the past 3 months.

EXAM:
CHEST  2 VIEW

[chest pa]
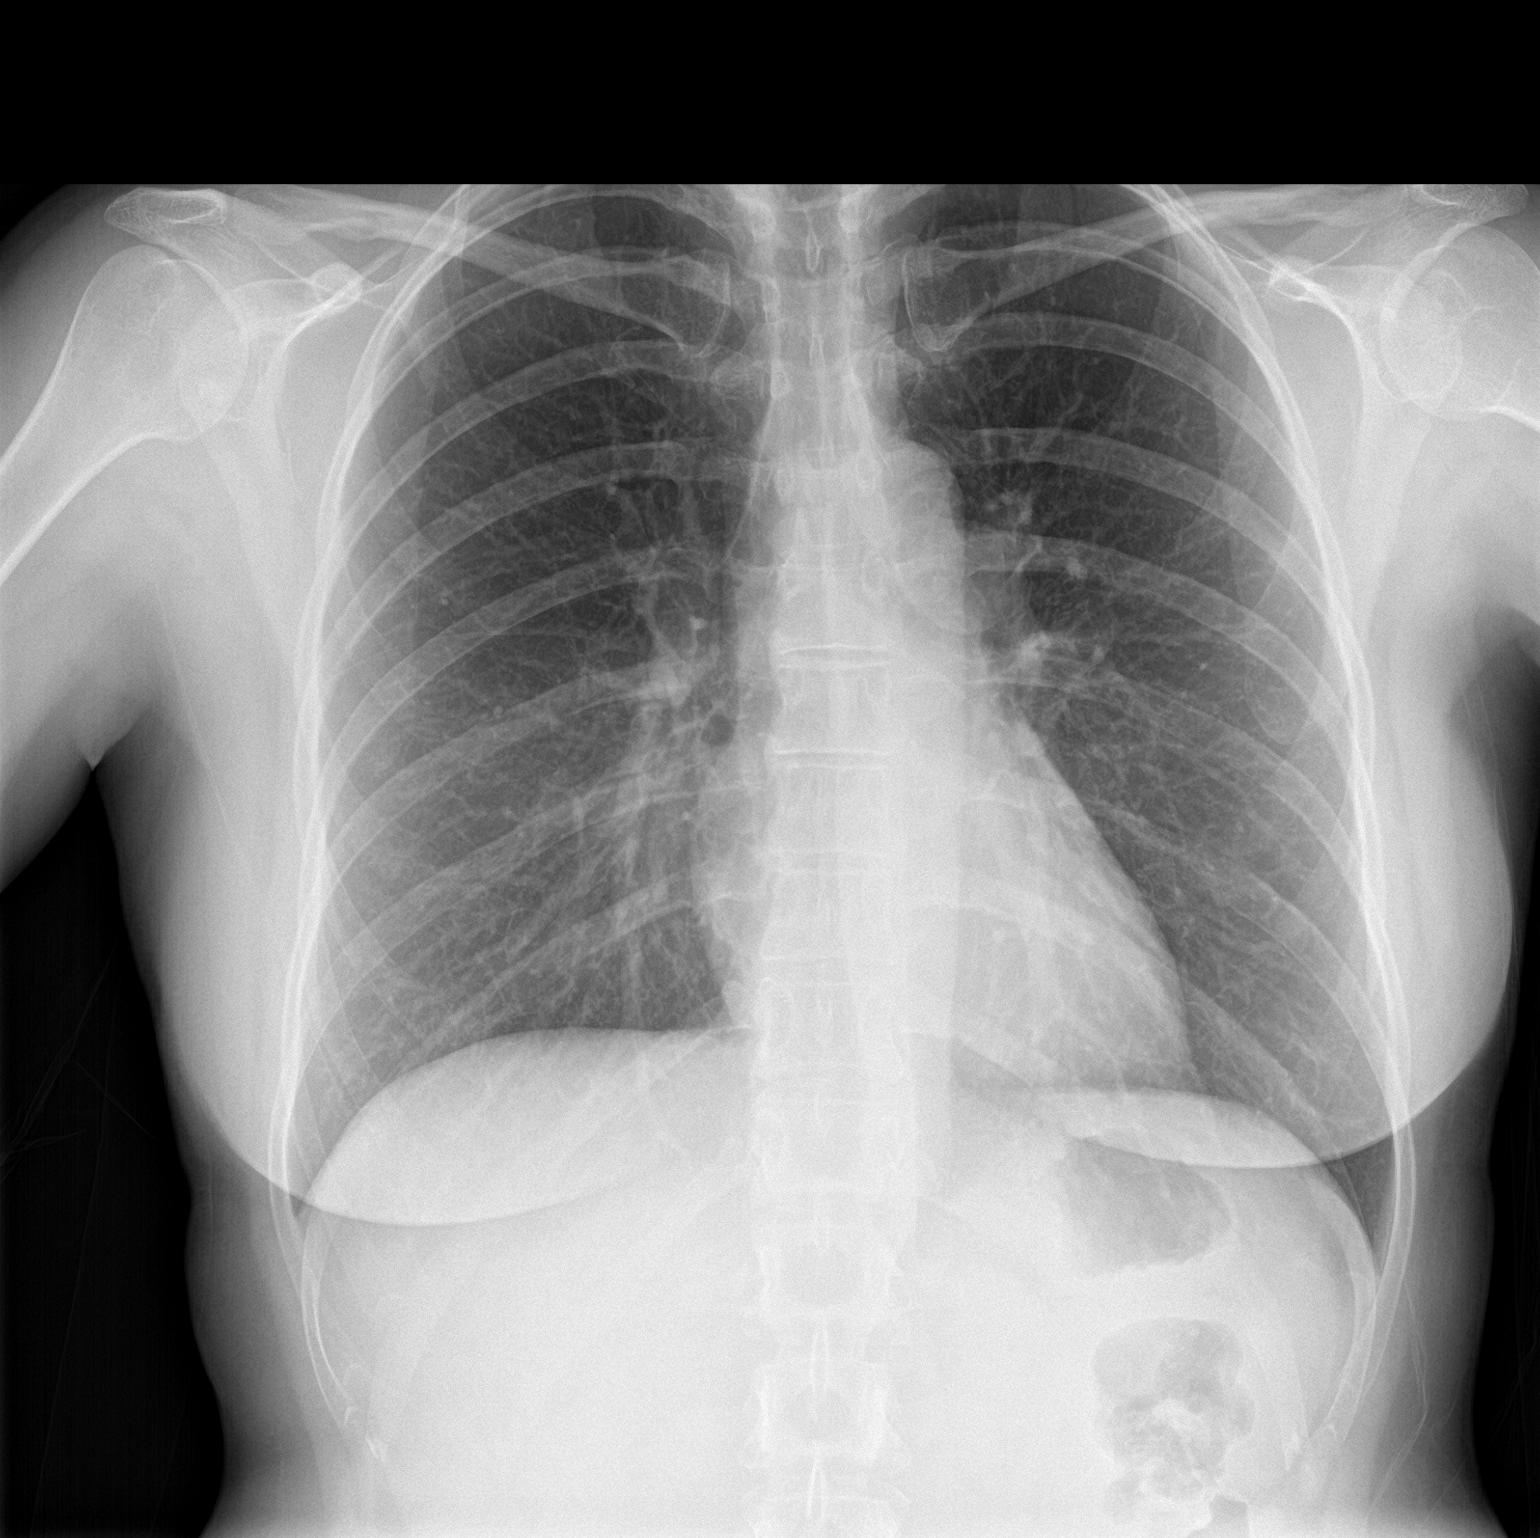

[chest lat]
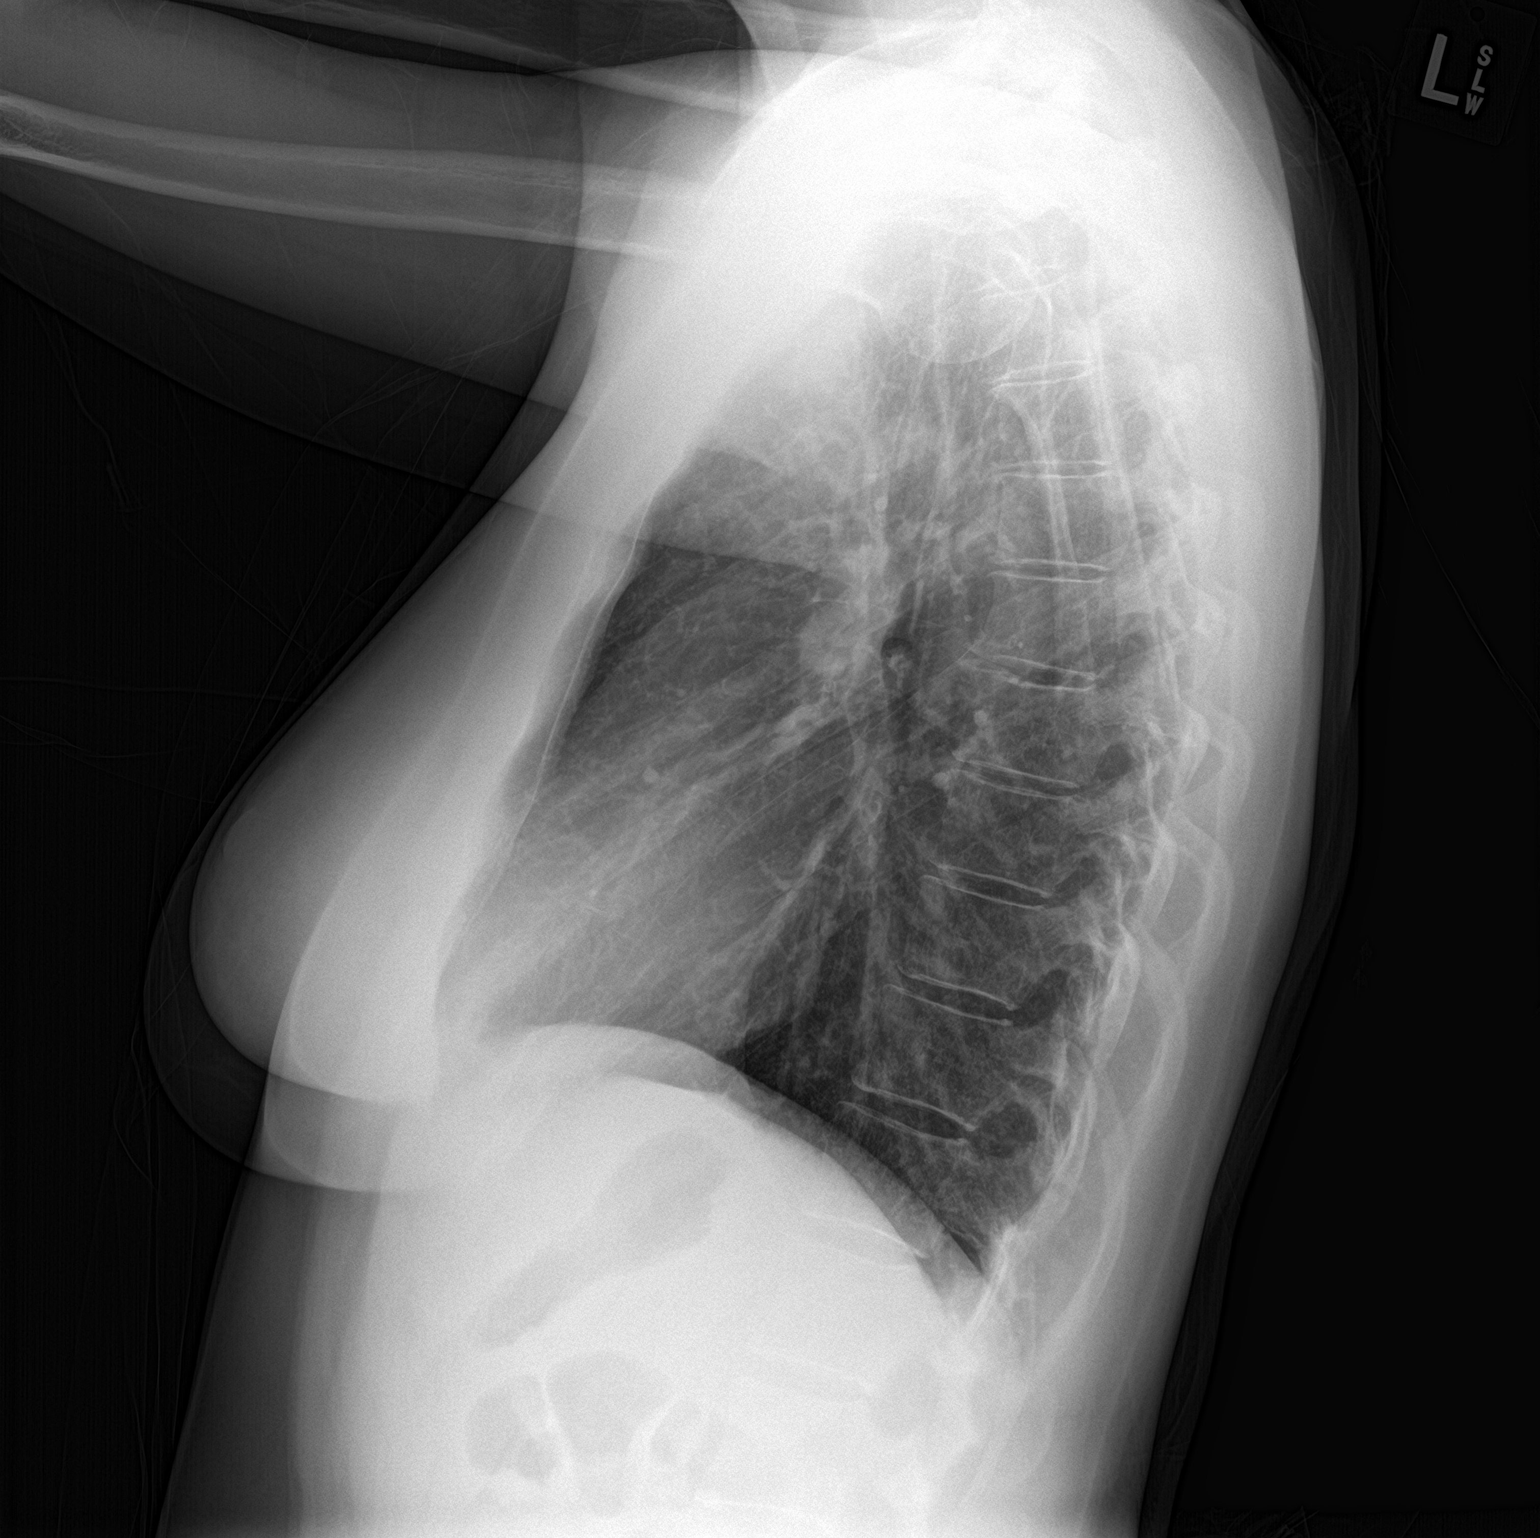

[2 of 2 positions shown; findings below may reference images not displayed]

FINDINGS: The heart size and mediastinal contours are within normal limits.
Both lungs are clear. The visualized skeletal structures are
unremarkable.
IMPRESSION: No active cardiopulmonary disease.

## 2018-01-07 MED ORDER — PREDNISONE 20 MG PO TABS: ORAL_TABLET | ORAL | 0 refills | Status: DC

## 2018-01-07 MED ORDER — OSELTAMIVIR PHOSPHATE 75 MG PO CAPS: 75.0000 mg | ORAL_CAPSULE | Freq: Two times a day (BID) | ORAL | 0 refills | Status: DC

## 2018-01-07 NOTE — ED Triage Notes (Signed)
Pt c/o cough x 2 mths; worse x 2-3 days with sinus pressure, hoarseness and nasal congestion; fever since yesterday up to 101.

## 2018-01-07 NOTE — ED Provider Notes (Signed)
[facility]    CSN: [id number] Arrival date & time: 01/07/18  1235     History   Chief Complaint Chief Complaint  Patient presents with  . Cough  . Hoarse    HPI Jasmin Duran is a 47 y.o. female.   Patient developed a mild cold about 3 months ago.  Her initial sore throat and sinus congestion resolved but her mild cough has persisted although she has not felt ill.  However, 2 to 3 days ago she developed increased sinus congestion with sore throat and increased cough.  She has been hoarse today.  Yesterday she developed myalgias, fever to 101, and tightness in her anterior chest.  No pleuritic pain. She has a past history of exercise induced asthma, and family history of asthma (father and paternal aunts).  She notes that her colds often linger.   The history is provided by the patient.    Past Medical History:  Diagnosis Date  . Abnormal Pap smear of cervix    8/16 ASCUS HPV HR +  . Anxiety   . Insomnia     Patient Active Problem List   Diagnosis Date Noted  . Insomnia 07/10/2013    Past Surgical History:  Procedure Laterality Date  . WISDOM TOOTH EXTRACTION      OB History    Gravida Para Term Preterm AB Living   0             SAB TAB Ectopic Multiple Live Births                   Home Medications    Prior to Admission medications   Medication Sig Start Date End Date Taking? Authorizing Provider  clonazePAM (KLONOPIN) 1 MG tablet Take 1 tablet by mouth daily. 08/13/17   [provider]  NUVARING 0.12-0.015 MG/24HR vaginal ring INSERT 1 RING VAGINALLY AND LEAVE IN PLACE FOR 4 CONSECUTIVE WEEKS, THEN REMOVE FOR 3 DAYS 10/21/17   Romualdo Bolk, MD  oseltamivir (TAMIFLU) 75 MG capsule Take 1 capsule (75 mg total) by mouth every 12 (twelve) hours. 01/07/18   Lattie Haw, MD  predniSONE (DELTASONE) 20 MG tablet Take one tab by mouth twice daily for 4 days, then one daily. Take with food. 01/07/18   Lattie Haw, MD     Family History Family History  Problem Relation Age of Onset  . Breast cancer Maternal Grandmother   . Cancer Maternal Grandfather        lung cancer  . Asthma Father     Social History Social History   Tobacco Use  . Smoking status: Never Smoker  . Smokeless tobacco: Never Used  Substance Use Topics  . Alcohol use: No  . Drug use: No     Allergies   Patient has no known allergies.   Review of Systems Review of Systems + sore throat + hoarse + cough No pleuritic pain, but feels tight in anterior chest + wheezing + nasal congestion + post-nasal drainage + sinus pain/pressure No itchy/red eyes ? earache No hemoptysis No SOB + fever, + chills No nausea No vomiting No abdominal pain No diarrhea No urinary symptoms No skin rash + fatigue + myalgias + headache Used OTC meds without relief   Physical Exam Triage Vital Signs ED Triage Vitals  Enc Vitals Group     BP 01/07/18 1259 (!) 158/101     Pulse Rate 01/07/18 1259 (!) 101     Resp 01/07/18 1259 16  Temp 01/07/18 1259 99 F (37.2 C)     Temp Source 01/07/18 1259 Oral     SpO2 01/07/18 1259 100 %     Weight 01/07/18 1300 159 lb (72.1 kg)     Height 01/07/18 1300 5\' 8"  (1.727 m)     Head Circumference --      Peak Flow --      Pain Score 01/07/18 1300 0     Pain Loc --      Pain Edu? --      Excl. in GC? --    No data found.  Updated Vital Signs BP (!) 158/101 (BP Location: Right Arm)   Pulse (!) 101   Temp 99 F (37.2 C) (Oral)   Resp 16   Ht 5\' 8"  (1.727 m)   Wt 159 lb (72.1 kg)   LMP 12/22/2017   SpO2 100%   BMI 24.18 kg/m   Visual Acuity Right Eye Distance:   Left Eye Distance:   Bilateral Distance:    Right Eye Near:   Left Eye Near:    Bilateral Near:     Physical Exam Nursing notes and Vital Signs reviewed. Appearance:  Patient appears stated age, and in no acute distress Eyes:  Pupils are equal, round, and reactive to light and accomodation.  Extraocular  movement is intact.  Conjunctivae are not inflamed  Ears:  Canals normal.  Right tympanic membrane scarred but otherwise normal; left tympanic membrane normal. Nose:  Mildly congested turbinates.  No sinus tenderness.  Pharynx:  Normal Neck:  Supple.  Enlarged posterior/lateral nodes are palpated bilaterally, tender to palpation on the left.   Lungs:  Clear to auscultation.  Breath sounds are equal.  Moving air well. Heart:  Regular rate and rhythm without murmurs, rubs, or gallops.  Rate 100 Abdomen:  Nontender without masses or hepatosplenomegaly.  Bowel sounds are present.  No CVA or flank tenderness.  Extremities:  No edema.  Skin:  No rash present.    UC Treatments / Results  Labs (all labs ordered are listed, but only abnormal results are displayed) Labs Reviewed  POCT INFLUENZA A/B:  Positive influenza A    EKG  EKG Interpretation None       Radiology Dg Chest 2 View  Result Date: 01/07/2018 CLINICAL DATA:  Persistent cough for the past 3 months. EXAM: CHEST  2 VIEW COMPARISON:  None. FINDINGS: The heart size and mediastinal contours are within normal limits. Both lungs are clear. The visualized skeletal structures are unremarkable. IMPRESSION: No active cardiopulmonary disease. Electronically Signed   By: 12/24/2017 M.D.   On: 01/07/2018 14:09    Procedures Procedures (including critical care time)  Medications Ordered in UC Medications - No data to display   Initial Impression / Assessment and Plan / UC Course  I have reviewed the triage vital signs and the nursing notes.  Pertinent labs & imaging results that were available during my care of the patient were reviewed by me and considered in my medical decision making (see chart for details).    Negative chest X-ray reassuring. Begin Tamiflu.  Begin prednisone burst/taper. Take plain guaifenesin (1200mg  extended release tabs such as Mucinex) twice daily, with plenty of water, for cough and congestion.  May  add Pseudoephedrine (30mg , one or two every 4 to 6 hours) for sinus congestion.  Get adequate rest.   May use Afrin nasal spray (or generic oxymetazoline) each morning for about 5 days and then discontinue.  Also recommend  using saline nasal spray several times daily and saline nasal irrigation (AYR is a common brand).  Use Flonase nasal spray each morning after using Afrin nasal spray and saline nasal irrigation. Try warm salt water gargles for sore throat.  Stop all antihistamines for now, and other non-prescription cough/cold preparations. May take Delsym Cough Suppressant at bedtime for nighttime cough.  Followup with Family Doctor if not improved in one week.  Note elevated BP today.  Patient reports that her BP is usually normal.    Final Clinical Impressions(s) / UC Diagnoses   Final diagnoses:  Influenza A    ED Discharge Orders        Ordered    oseltamivir (TAMIFLU) 75 MG capsule  Every 12 hours     01/07/18 1420    predniSONE (DELTASONE) 20 MG tablet     01/07/18 1420           Lattie Haw, MD 01/07/18 1425

## 2018-01-07 NOTE — Discharge Instructions (Signed)
Take plain guaifenesin (1200mg extended release tabs such as Mucinex) twice daily, with plenty of water, for cough and congestion.  May add Pseudoephedrine (30mg, one or two every 4 to 6 hours) for sinus congestion.  Get adequate rest.   May use Afrin nasal spray (or generic oxymetazoline) each morning for about 5 days and then discontinue.  Also recommend using saline nasal spray several times daily and saline nasal irrigation (AYR is a common brand).  Use Flonase nasal spray each morning after using Afrin nasal spray and saline nasal irrigation. Try warm salt water gargles for sore throat.  Stop all antihistamines for now, and other non-prescription cough/cold preparations. May take Delsym Cough Suppressant at bedtime for nighttime cough.  

## 2018-02-10 ENCOUNTER — Ambulatory Visit (INDEPENDENT_AMBULATORY_CARE_PROVIDER_SITE_OTHER): Payer: Managed Care, Other (non HMO) | Admitting: Physician Assistant

## 2018-02-10 ENCOUNTER — Encounter: Payer: Self-pay | Admitting: Physician Assistant

## 2018-02-10 VITALS — BP 137/91 | HR 94 | Ht 68.0 in | Wt 165.0 lb

## 2018-02-10 DIAGNOSIS — J45990 Exercise induced bronchospasm: Secondary | ICD-10-CM | POA: Diagnosis not present

## 2018-02-10 DIAGNOSIS — Z13220 Encounter for screening for lipoid disorders: Secondary | ICD-10-CM

## 2018-02-10 DIAGNOSIS — Z130 Encounter for screening for diseases of the blood and blood-forming organs and certain disorders involving the immune mechanism: Secondary | ICD-10-CM | POA: Diagnosis not present

## 2018-02-10 DIAGNOSIS — Z131 Encounter for screening for diabetes mellitus: Secondary | ICD-10-CM

## 2018-02-10 DIAGNOSIS — Z79899 Other long term (current) drug therapy: Secondary | ICD-10-CM

## 2018-02-10 DIAGNOSIS — G2581 Restless legs syndrome: Secondary | ICD-10-CM | POA: Diagnosis not present

## 2018-02-10 DIAGNOSIS — Z1329 Encounter for screening for other suspected endocrine disorder: Secondary | ICD-10-CM | POA: Diagnosis not present

## 2018-02-10 DIAGNOSIS — F5104 Psychophysiologic insomnia: Secondary | ICD-10-CM

## 2018-02-10 DIAGNOSIS — Z7689 Persons encountering health services in other specified circumstances: Secondary | ICD-10-CM

## 2018-02-10 DIAGNOSIS — R030 Elevated blood-pressure reading, without diagnosis of hypertension: Secondary | ICD-10-CM

## 2018-02-10 MED ORDER — CLONAZEPAM 1 MG PO TABS: 1.0000 mg | ORAL_TABLET | Freq: Every evening | ORAL | 1 refills | Status: DC | PRN

## 2018-02-10 MED ORDER — ALBUTEROL SULFATE HFA 108 (90 BASE) MCG/ACT IN AERS: INHALATION_SPRAY | RESPIRATORY_TRACT | 2 refills | Status: DC

## 2018-02-10 NOTE — Progress Notes (Signed)
HPI:                                                                Jasmin Duran is a 47 y.o. female who presents to [name]: Primary Care Sports Medicine today to establish care  Current concerns: medication refills, cough  Cough  This is a recurrent problem. The current episode started more than 1 month ago. The problem has been unchanged. The problem occurs every few hours. The cough is non-productive. Associated symptoms include ear congestion, nasal congestion, postnasal drip and rhinorrhea. Pertinent negatives include no fever or shortness of breath. The symptoms are aggravated by exercise, cold air and other (laughing). She has tried a beta-agonist inhaler for the symptoms. The treatment provided moderate relief. Her past medical history is significant for bronchitis.   Chronic insomnia: has been taking Clonazepam 1-2 mg nightly for years. States this helps her with both RLS and insomnia. She occasionally needs 2 mg for sleep. Prior meds: Lorazepam, Ambien, RLS medicine  Depression screen [facility] 2/9 02/10/2018  Decreased Interest 0  Down, Depressed, Hopeless 0  PHQ - 2 Score 0  Altered sleeping 1  Tired, decreased energy 1  Change in appetite 0  Feeling bad or failure about yourself  0  Trouble concentrating 0  Moving slowly or fidgety/restless 0  Suicidal thoughts 0  PHQ-9 Score 2    GAD 7 : Generalized Anxiety Score 02/10/2018  Nervous, Anxious, on Edge 0  Control/stop worrying 0  Worry too much - different things 0  Trouble relaxing 0  Restless 0  Easily annoyed or irritable 0  Afraid - awful might happen 0  Total GAD 7 Score 0  Anxiety Difficulty Not difficult at all      Past Medical History:  Diagnosis Date  . Abnormal Pap smear of cervix    8/16 ASCUS HPV HR +  . Anxiety   . Cold induced bronchospasm   . Insomnia    Past Surgical History:  Procedure Laterality Date  . WISDOM TOOTH EXTRACTION     Social History   Tobacco Use   . Smoking status: Never Smoker  . Smokeless tobacco: Never Used  Substance Use Topics  . Alcohol use: No   family history includes Asthma in her father, maternal aunt, and paternal grandfather; Breast cancer in her maternal grandmother; Cancer in her maternal grandfather.    ROS: negative except as noted in the HPI  Medications: Current Outpatient Medications  Medication Sig Dispense Refill  . cetirizine (ZYRTEC) 10 MG tablet Take 10 mg by mouth daily.    . fluticasone (FLONASE) 50 MCG/ACT nasal spray Place into both nostrils daily.    Marland Kitchen NUVARING 0.12-0.015 MG/24HR vaginal ring INSERT 1 RING VAGINALLY AND LEAVE IN PLACE FOR 4 CONSECUTIVE WEEKS, THEN REMOVE FOR 3 DAYS 3 each 3  . albuterol (PROVENTIL HFA;VENTOLIN HFA) 108 (90 Base) MCG/ACT inhaler Inhale 2 puffs, 15 minutes before physical activity 1 Inhaler 2  . clonazePAM (KLONOPIN) 1 MG tablet Take 1 tablet (1 mg total) by mouth at bedtime as needed for anxiety. 90 tablet 1   No current facility-administered medications for this visit.    No Known Allergies     Objective:  BP (!) 137/91   Pulse 94  Ht 5\' 8"  (1.727 m)   Wt 165 lb (74.8 kg)   LMP 01/24/2018 (Exact Date)   BMI 25.09 kg/m  Gen:  alert, not ill-appearing, no distress, appropriate for age HEENT: head normocephalic without obvious abnormality, conjunctiva and cornea clear, trachea midline Pulm: Normal work of breathing, normal phonation, clear to auscultation bilaterally, no wheezes, rales or rhonchi CV: Normal rate, regular rhythm, s1 and s2 distinct, no murmurs, clicks or rubs  Neuro: alert and oriented x 3, no tremor MSK: extremities atraumatic, normal gait and station Skin: intact, no rashes on exposed skin, no jaundice, no cyanosis Psych: well-groomed, cooperative, good eye contact, euthymic mood, affect mood-congruent, speech is articulate, and thought processes clear and goal-directed    No results found for this or any previous visit (from the past  72 hour(s)). No results found.    Assessment and Plan: 47 y.o. female with   1. Encounter to establish care - reviewed PMH, PSH, PFH, medications and allergies - reviewed health maintenance - Pap smear and mammogram UTD - declines influenza - negative PHQ2  2. Chronic insomnia - we discussed tolerance and dependence of using benzos long-term for sleep. She is unable to sleep without medication. Counseled not to take 2 mg even when she is unable to sleep. Encouraged sleep hygiene. General measures for managing RLS. Referral to CBT. - plan to taper Clonazepam - clonazePAM (KLONOPIN) 1 MG tablet; Take 1 tablet (1 mg total) by mouth at bedtime as needed for anxiety.  Dispense: 90 tablet; Refill: 1  3. Exercise induced bronchospasm - albuterol (PROVENTIL HFA;VENTOLIN HFA) 108 (90 Base) MCG/ACT inhaler; Inhale 2 puffs, 15 minutes before physical activity  Dispense: 1 Inhaler; Refill: 2  4. Screening for lipid disorders - Lipid Panel w/reflex Direct LDL  5. Screening for thyroid disorder - TSH + free T4  6. Screening for diabetes mellitus - Comprehensive metabolic panel  7. Screening for blood disease - CBC  8. Restless leg syndrome - counseled on general measures.  - clonazePAM (KLONOPIN) 1 MG tablet; Take 1 tablet (1 mg total) by mouth at bedtime as needed for anxiety.  Dispense: 90 tablet; Refill: 1  9. Chronic use of benzodiazepine for therapeutic purpose - checked NCCSRS, no red flags - slow taper over 3 months - clonazePAM (KLONOPIN) 1 MG tablet; Take 1 tablet (1 mg total) by mouth at bedtime as needed for anxiety.  Dispense: 90 tablet; Refill: 1   Patient education and anticipatory guidance given Patient agrees with treatment plan Follow-up in 6 months for medication management or sooner as needed if symptoms worsen or fail to improve  49 PA-C

## 2018-02-10 NOTE — Patient Instructions (Addendum)
For insomnia: - the goal is to re-learn how to sleep on your own without medication and reserve medication for as needed use - try reducing your clonazepam to every other night for sleep. Avoid taking 2 mg even if you are not sleeping well. This will just make it harder to taper and sleep on your own - I will place a referral for cognitive behavioral therapy - practice good sleep hygiene (see below) - for restless legs: avoid benadryl/diphenhydramine-containing products, light exercise before bed, warm bath/massage   Sleep Hygiene . Limiting daytime naps to 30 minutes . Napping does not make up for inadequate nighttime sleep. However, a short nap of 20-30 minutes can help to improve mood, alertness and performance.  . Avoiding stimulants such as  caffeine and nicotine close to bedtime.  And when it comes to alcohol, moderation is key 4. While alcohol is well-known to help you fall asleep faster, too much close to bedtime can disrupt sleep in the second half of the night as the body begins to process the alcohol.    . Exercising to promote good quality sleep.  As little as 10 minutes of aerobic exercise, such as walking or cycling, can drastically improve nighttime sleep quality.  For the best night's sleep, most people should avoid strenuous workouts close to bedtime. However, the effect of intense nighttime exercise on sleep differs from person to person, so find out what works best for you.   . Steering clear of food that can be disruptive right before sleep.   Heavy or rich foods, fatty or fried meals, spicy dishes, citrus fruits, and carbonated drinks can trigger indigestion for some people. When this occurs close to bedtime, it can lead to painful heartburn that disrupts sleep. . Ensuring adequate exposure to natural light.  This is particularly important for individuals who may not venture outside frequently. Exposure to sunlight during the day, as well as darkness at night, helps to maintain a  healthy sleep-wake cycle . Marland Kitchen Establishing a regular relaxing bedtime routine.  A regular nightly routine helps the body recognize that it is bedtime. This could include taking warm shower or bath, reading a book, or light stretches. When possible, try to avoid emotionally upsetting conversations and activities before attempting to sleep. . Making sure that the sleep environment is pleasant.  Mattress and pillows should be comfortable. The bedroom should be cool - between 60 and 67 degrees - for optimal sleep. Bright light from lamps, cell phone and TV screens can make it difficult to fall asleep4, so turn those light off or adjust them when possible. Consider using blackout curtains, eye shades, ear plugs, "white noise" machines, humidifiers, fans and other devices that can make the bedroom more relaxing. . Meditation. YouTube Kristopher Glee. There are many smartphone apps as well

## 2018-02-13 ENCOUNTER — Encounter: Payer: Self-pay | Admitting: Physician Assistant

## 2018-02-13 DIAGNOSIS — J45990 Exercise induced bronchospasm: Secondary | ICD-10-CM | POA: Insufficient documentation

## 2018-02-13 DIAGNOSIS — G2581 Restless legs syndrome: Secondary | ICD-10-CM | POA: Insufficient documentation

## 2018-02-13 DIAGNOSIS — R030 Elevated blood-pressure reading, without diagnosis of hypertension: Secondary | ICD-10-CM | POA: Insufficient documentation

## 2018-02-13 DIAGNOSIS — Z79899 Other long term (current) drug therapy: Secondary | ICD-10-CM | POA: Insufficient documentation

## 2018-02-19 ENCOUNTER — Other Ambulatory Visit: Payer: Self-pay

## 2018-02-19 DIAGNOSIS — Z79899 Other long term (current) drug therapy: Secondary | ICD-10-CM

## 2018-02-19 DIAGNOSIS — G2581 Restless legs syndrome: Secondary | ICD-10-CM

## 2018-02-19 DIAGNOSIS — J45990 Exercise induced bronchospasm: Secondary | ICD-10-CM

## 2018-02-19 DIAGNOSIS — F5104 Psychophysiologic insomnia: Secondary | ICD-10-CM

## 2018-02-19 MED ORDER — CLONAZEPAM 1 MG PO TABS
1.0000 mg | ORAL_TABLET | Freq: Every evening | ORAL | 1 refills | Status: DC | PRN
Start: 2018-02-19 — End: 2018-08-19

## 2018-02-19 MED ORDER — ALBUTEROL SULFATE HFA 108 (90 BASE) MCG/ACT IN AERS: INHALATION_SPRAY | RESPIRATORY_TRACT | 2 refills | Status: AC

## 2018-02-20 LAB — LIPID PANEL W/REFLEX DIRECT LDL
CHOLESTEROL: 190 mg/dL (ref <200)
HDL: 55 mg/dL (ref >50)
LDL CHOLESTEROL (CALC): 104 mg/dL — AB
Non-HDL Cholesterol (Calc): 135 mg/dL (calc) — ABNORMAL HIGH (ref <130)
TRIGLYCERIDES: 192 mg/dL — AB (ref <150)
Total CHOL/HDL Ratio: 3.5 (calc) (ref <5.0)

## 2018-02-20 LAB — CBC
HEMATOCRIT: 38.2 % (ref 35.0–45.0)
Hemoglobin: 13.3 g/dL (ref 11.7–15.5)
MCH: 31.7 pg (ref 27.0–33.0)
MCHC: 34.8 g/dL (ref 32.0–36.0)
MCV: 91 fL (ref 80.0–100.0)
MPV: 10.7 fL (ref 7.5–12.5)
PLATELETS: 308 10*3/uL (ref 140–400)
RBC: 4.2 10*6/uL (ref 3.80–5.10)
RDW: 12.5 % (ref 11.0–15.0)
WBC: 5.2 10*3/uL (ref 3.8–10.8)

## 2018-02-20 LAB — COMPREHENSIVE METABOLIC PANEL
AG Ratio: 1.3 (calc) (ref 1.0–2.5)
ALBUMIN MSPROF: 4.1 g/dL (ref 3.6–5.1)
ALKALINE PHOSPHATASE (APISO): 52 U/L (ref 33–115)
ALT: 9 U/L (ref 6–29)
AST: 12 U/L (ref 10–35)
BILIRUBIN TOTAL: 0.5 mg/dL (ref 0.2–1.2)
BUN: 18 mg/dL (ref 7–25)
CALCIUM: 9.6 mg/dL (ref 8.6–10.2)
CHLORIDE: 106 mmol/L (ref 98–110)
CO2: 26 mmol/L (ref 20–32)
CREATININE: 0.9 mg/dL (ref 0.50–1.10)
Globulin: 3.1 g/dL (calc) (ref 1.9–3.7)
Glucose, Bld: 90 mg/dL (ref 65–99)
POTASSIUM: 4.1 mmol/L (ref 3.5–5.3)
Sodium: 140 mmol/L (ref 135–146)
Total Protein: 7.2 g/dL (ref 6.1–8.1)

## 2018-02-20 LAB — TSH+FREE T4: TSH W/REFLEX TO FT4: 2.42 m[IU]/L

## 2018-02-21 NOTE — Progress Notes (Signed)
Good afternoon Jasmin Duran,  Your labs look great. Your triglycerides (type of cholesterol) are just mildly elevated, but otherwise cholesterol is in a healthy range.  Best, Vinetta Bergamo

## 2018-03-20 ENCOUNTER — Ambulatory Visit: Payer: 59 | Admitting: Clinical

## 2018-08-18 ENCOUNTER — Ambulatory Visit: Payer: Managed Care, Other (non HMO) | Admitting: Physician Assistant

## 2018-08-19 ENCOUNTER — Ambulatory Visit (INDEPENDENT_AMBULATORY_CARE_PROVIDER_SITE_OTHER): Payer: Managed Care, Other (non HMO) | Admitting: Physician Assistant

## 2018-08-19 ENCOUNTER — Encounter: Payer: Self-pay | Admitting: Physician Assistant

## 2018-08-19 VITALS — BP 148/96 | HR 89 | Resp 14 | Wt 164.0 lb

## 2018-08-19 DIAGNOSIS — G2581 Restless legs syndrome: Secondary | ICD-10-CM

## 2018-08-19 DIAGNOSIS — R030 Elevated blood-pressure reading, without diagnosis of hypertension: Secondary | ICD-10-CM | POA: Diagnosis not present

## 2018-08-19 DIAGNOSIS — Z79899 Other long term (current) drug therapy: Secondary | ICD-10-CM

## 2018-08-19 DIAGNOSIS — F5104 Psychophysiologic insomnia: Secondary | ICD-10-CM | POA: Diagnosis not present

## 2018-08-19 MED ORDER — CLONAZEPAM 1 MG PO TABS: 1.0000 mg | ORAL_TABLET | Freq: Every evening | ORAL | 1 refills | Status: DC | PRN

## 2018-08-19 NOTE — Progress Notes (Signed)
HPI:                                                                Jasmin Duran is a 47 y.o. female who presents to [facility]: Primary Care Sports Medicine today for medication management  RLS/chronic insomnia: has been taking Clonazepam 1-2 mg nightly for years. States this helps her with both RLS and insomnia. She occasionally needs 2 mg for sleep. Her dose was reduced back to 1 mg, 6 months ago. Sleeping 8-9 hours per night, no nighttime awakenings. No RLS symptoms. Prior meds: Lorazepam, Ambien, uncertain RLS medicine   Depression screen Jasmin Duran 2/9 08/19/2018 02/10/2018  Decreased Interest 0 0  Down, Depressed, Hopeless 0 0  PHQ - 2 Score 0 0  Altered sleeping 1 1  Tired, decreased energy 1 1  Change in appetite 0 0  Feeling bad or failure about yourself  0 0  Trouble concentrating 0 0  Moving slowly or fidgety/restless 0 0  Suicidal thoughts 0 0  PHQ-9 Score 2 2    GAD 7 : Generalized Anxiety Score 08/19/2018 02/10/2018  Nervous, Anxious, on Edge 0 0  Control/stop worrying 0 0  Worry too much - different things 0 0  Trouble relaxing 0 0  Restless 0 0  Easily annoyed or irritable 0 0  Afraid - awful might happen 0 0  Total GAD 7 Score 0 0  Anxiety Difficulty - Not difficult at all      Past Medical History:  Diagnosis Date  . Abnormal Pap smear of cervix    8/16 ASCUS HPV HR +  . Anxiety   . Cold induced bronchospasm   . Insomnia    Past Surgical History:  Procedure Laterality Date  . WISDOM TOOTH EXTRACTION     Social History   Tobacco Use  . Smoking status: Never Smoker  . Smokeless tobacco: Never Used  Substance Use Topics  . Alcohol use: No   family history includes Asthma in her father, maternal aunt, and paternal grandfather; Breast cancer in her maternal grandmother; Cancer in her maternal grandfather.    ROS: negative except as noted in the HPI  Medications: Current Outpatient Medications  Medication Sig Dispense  Refill  . albuterol (PROVENTIL HFA;VENTOLIN HFA) 108 (90 Base) MCG/ACT inhaler Inhale 2 puffs, 15 minutes before physical activity 1 Inhaler 2  . clonazePAM (KLONOPIN) 1 MG tablet Take 1 tablet (1 mg total) by mouth at bedtime as needed for anxiety. 90 tablet 1  . NUVARING 0.12-0.015 MG/24HR vaginal ring INSERT 1 RING VAGINALLY AND LEAVE IN PLACE FOR 4 CONSECUTIVE WEEKS, THEN REMOVE FOR 3 DAYS 3 each 3   No current facility-administered medications for this visit.    No Known Allergies     Objective:  BP (!) 148/96   Pulse 89   Resp 14   Wt 164 lb (74.4 kg)   LMP 07/24/2018 (Exact Date)   BMI 24.94 kg/m  Gen:  alert, not ill-appearing, no distress, appropriate for age HEENT: head normocephalic without obvious abnormality, conjunctiva and cornea clear, trachea midline Pulm: Normal work of breathing, normal phonation, clear to auscultation bilaterally, no wheezes, rales or rhonchi CV: Normal rate, regular rhythm, s1 and s2 distinct, no murmurs, clicks or rubs  Neuro: alert  and oriented x 3, no tremor MSK: extremities atraumatic, normal gait and station Skin: intact, no rashes on exposed skin, no jaundice, no cyanosis Psych: well-groomed, cooperative, good eye contact, euthymic mood, affect mood-congruent, speech is articulate, and thought processes clear and goal-directed  BP Readings from Last 3 Encounters:  08/19/18 (!) 148/96  02/10/18 (!) 137/91  01/07/18 (!) 158/101     No results found for this or any previous visit (from the past 72 hour(s)). No results found.    Assessment and Plan: 47 y.o. female with   .Diagnoses and all orders for this visit:  Restless leg syndrome -     clonazePAM (KLONOPIN) 1 MG tablet; Take 1 tablet (1 mg total) by mouth at bedtime as needed for anxiety.  Chronic insomnia -     clonazePAM (KLONOPIN) 1 MG tablet; Take 1 tablet (1 mg total) by mouth at bedtime as needed for anxiety.  Chronic use of benzodiazepine for therapeutic  purpose -     clonazePAM (KLONOPIN) 1 MG tablet; Take 1 tablet (1 mg total) by mouth at bedtime as needed for anxiety.  Elevated blood pressure reading   Insomnia, RLS - checked NCCSRS, last fill 07/22/18, no red flags. Refill Clonazepam 1 mg QHS, 6 month supply  Reviewed HM - mammogram scheduled for 9/10 - Pap UTD, followed by OB/GYN  Elevated BP - BP in hypertensive range at last several office visits. Patient endorses weight gain and decreased physical activity - no additional CVD risk factors - counseled on therapeutic lifestyle changes - patient to monitor and log BP's at home and f/u in 1 month  Patient education and anticipatory guidance given Patient agrees with treatment plan Follow-up in 1 month for BP, Q57mos for medication management or sooner as needed if symptoms worsen or fail to improve  Levonne Hubert PA-C

## 2018-08-19 NOTE — Patient Instructions (Addendum)
For your blood pressure: - Goal <130/80 - monitor and log blood pressures at home - check around the same time each day in a relaxed setting - Limit salt to <2000 mg/day - Follow DASH eating plan - limit alcohol to 2 standard drinks per day for men and 1 per day for women - avoid tobacco products - weight loss: 7% of current body weight   Physical Activity Recommendations for modifying lipids and lowering blood pressure Engage in aerobic physical activity to reduce LDL-cholesterol, non-HDL-cholesterol, and blood pressure  Frequency: 3-4 sessions per week  Intensity: moderate to vigorous  Duration: 40 minutes on average  Physical Activity Recommendations for secondary prevention 1. Aerobic exercise  Frequency: 3-5 sessions per week  Intensity: 50-80% capacity  Duration: 20 - 60 minutes  Examples: walking, treadmill, cycling, rowing, stair climbing, and arm/leg ergometry  2. Resistance exercise  Frequency: 2-3 sessions per week  Intensity: 10-15 repetitions/set to moderate fatigue  Duration: 1-3 sets of 8-10 upper and lower body exercises  Examples: calisthenics, elastic bands, cuff/hand weights, dumbbels, free weights, wall pulleys, and weight machines  Heart-Healthy Lifestyle  Eating a diet rich in vegetables, fruits and whole grains: also includes low-fat dairy products, poultry, fish, legumes, and nuts; limit intake of sweets, sugar-sweetened beverages and red meats  Getting regular exercise  Maintaining a healthy weight  Not smoking or getting help quitting  Staying on top of your health; for some people, lifestyle changes alone may not be enough to prevent a heart attack or stroke. In these cases, taking a statin at the right dose will most likely be necessary

## 2018-09-02 ENCOUNTER — Ambulatory Visit (INDEPENDENT_AMBULATORY_CARE_PROVIDER_SITE_OTHER): Payer: Managed Care, Other (non HMO) | Admitting: Certified Nurse Midwife

## 2018-09-02 ENCOUNTER — Encounter: Payer: Self-pay | Admitting: Certified Nurse Midwife

## 2018-09-02 ENCOUNTER — Other Ambulatory Visit: Payer: Self-pay

## 2018-09-02 VITALS — BP 110/70 | HR 68 | Resp 16 | Ht 67.0 in | Wt 160.0 lb

## 2018-09-02 DIAGNOSIS — Z3044 Encounter for surveillance of vaginal ring hormonal contraceptive device: Secondary | ICD-10-CM

## 2018-09-02 DIAGNOSIS — Z01419 Encounter for gynecological examination (general) (routine) without abnormal findings: Secondary | ICD-10-CM | POA: Diagnosis not present

## 2018-09-02 DIAGNOSIS — Z8041 Family history of malignant neoplasm of ovary: Secondary | ICD-10-CM | POA: Diagnosis not present

## 2018-09-02 MED ORDER — ETONOGESTREL-ETHINYL ESTRADIOL 0.12-0.015 MG/24HR VA RING: VAGINAL_RING | VAGINAL | 12 refills | Status: DC

## 2018-09-02 NOTE — Progress Notes (Signed)
47 y.o. G0P0 Married  Caucasian Fe here for annual exam. Periods normal, no issues. Nuvaring working well, denies any warning signs with use. Aware she has gained about 15 pounds, so working on diet and starting to exercise. Sees PCP Dr. Eddie Candle twice yearly  For medication management of Albuterol, Klonopin, labs. All stable per patient. Currently moving over the past few months, and will finally being in move soon. Mother recently diagnosed with ?stage 4 ovarian cancer. She just recently had biopsy and will be meeting with team soon. Patient will let us know what her final diagnosis from biopsy. Patient coping well with mother's diagnosis. No other health concerns today.  Patient's last menstrual period was 08/26/2018 (exact date).          Sexually active: Yes.    The current method of family planning is NuvaRing vaginal inserts.    Exercising: No.  exercise Smoker:  no  Review of Systems  Constitutional: Negative.   HENT: Negative.   Eyes: Negative.   Respiratory: Negative.   Cardiovascular: Negative.   Gastrointestinal: Negative.   Genitourinary: Negative.   Musculoskeletal: Negative.   Skin: Negative.   Neurological: Negative.   Endo/Heme/Allergies:       Craving sweets  Psychiatric/Behavioral: Negative.     Health Maintenance: Pap:  08-04-15 ASCUS HPV HR+, 08-08-16 neg HPV HR neg, 08-30-17 neg HPV HR neg History of Abnormal Pap: yes MMG:  08-09-17 category c density birads 1:neg Self Breast exams: no Colonoscopy:  none BMD:   none TDaP:  2011 Shingles: no Pneumonia: no Hep C and HIV: HIV neg 2015 Labs: no   reports that she has never smoked. She has never used smokeless tobacco. She reports that she does not drink alcohol or use drugs.  Past Medical History:  Diagnosis Date  . Abnormal Pap smear of cervix    8/16 ASCUS HPV HR +  . Anxiety   . Cold induced bronchospasm   . Insomnia     Past Surgical History:  Procedure Laterality Date  . WISDOM TOOTH EXTRACTION       Current Outpatient Medications  Medication Sig Dispense Refill  . albuterol (PROVENTIL HFA;VENTOLIN HFA) 108 (90 Base) MCG/ACT inhaler Inhale 2 puffs, 15 minutes before physical activity 1 Inhaler 2  . clonazePAM (KLONOPIN) 1 MG tablet Take 1 tablet (1 mg total) by mouth at bedtime as needed for anxiety. (Patient taking differently: Take 1 mg by mouth daily. ) 90 tablet 1  . NUVARING 0.12-0.015 MG/24HR vaginal ring INSERT 1 RING VAGINALLY AND LEAVE IN PLACE FOR 4 CONSECUTIVE WEEKS, THEN REMOVE FOR 3 DAYS 3 each 3   No current facility-administered medications for this visit.     Family History  Problem Relation Age of Onset  . Breast cancer Maternal Grandmother   . Cancer Maternal Grandfather        lung cancer  . Asthma Father   . Asthma Paternal Grandfather   . Asthma Maternal Aunt   . Ovarian cancer Mother        stage 4    ROS:  Pertinent items are noted in HPI.  Otherwise, a comprehensive ROS was negative.  Exam:   BP 110/70   Pulse 68   Resp 16   Ht 5\' 7"  (1.702 m)   Wt 160 lb (72.6 kg)   LMP 08/26/2018 (Exact Date)   BMI 25.06 kg/m  Height: 5\' 7"  (170.2 cm) Ht Readings from Last 3 Encounters:  09/02/18 5\' 7"  (1.702 m)  02/10/18 5'  8" (1.727 m)  01/07/18 5\' 8"  (1.727 m)    General appearance: alert, cooperative and appears stated age Head: Normocephalic, without obvious abnormality, atraumatic Neck: no adenopathy, supple, symmetrical, trachea midline and thyroid normal to inspection and palpation Lungs: clear to auscultation bilaterally Breasts: normal appearance, no masses or tenderness, No nipple retraction or dimpling, No nipple discharge or bleeding, No axillary or supraclavicular adenopathy Heart: regular rate and rhythm Abdomen: soft, non-tender; no masses,  no organomegaly Extremities: extremities normal, atraumatic, no cyanosis or edema Skin: Skin color, texture, turgor normal. No rashes or lesions Lymph nodes: Cervical, supraclavicular, and axillary  nodes normal. No abnormal inguinal nodes palpated Neurologic: Grossly normal   Pelvic: External genitalia:  no lesions              Urethra:  normal appearing urethra with no masses, tenderness or lesions              Bartholin's and Skene's: normal                 Vagina: normal appearing vagina with normal color and discharge, no lesions              Cervix: no cervical motion tenderness, no lesions and nulliparous appearance              Pap taken: No. Bimanual Exam:  Uterus:  normal size, contour, position, consistency, mobility, non-tender and anteverted              Adnexa: normal adnexa and no mass, fullness, tenderness               Rectovaginal: Confirms               Anus:  normal sphincter tone, no lesions  Chaperone present: yes  A:  Well Woman with normal exam  Contraception Nuvaring desired  Anxiety and asthma management with PCP  Weight gain, currently working on weight loss again  Newly diagnosed family history of ?stage 71, age 11 ovarian cancer mother (does not have biopsy results yet)    P:   Reviewed health and wellness pertinent to exam  Risks/benefits/warning signs of Nuvaring reviewed, desires Rx  Rx Nuvaring see order with instructions  Continue follow up with MD as indicated  Restart her regular exercise program and watch portion size  Discussed screening for ovarian cancer is limited to PUS with CA 125 which is not conclusive. Patient to advise once mother's information available and will advise to as indicated. Patient emotionally doing well and supportive of mother ([place]).  Pap smear: no   counseled on breast self exam, mammography screening, feminine hygiene, adequate intake of calcium and vitamin D, diet and exercise  return annually or prn  An After Visit Summary was printed and given to the patient.

## 2018-09-02 NOTE — Patient Instructions (Signed)

## 2018-09-09 ENCOUNTER — Encounter: Payer: Self-pay | Admitting: Certified Nurse Midwife

## 2018-09-11 ENCOUNTER — Telehealth: Payer: Self-pay | Admitting: Certified Nurse Midwife

## 2018-09-11 NOTE — Telephone Encounter (Signed)
Patient sent the following correspondence through MyChart. Routing to Leota Sauers, CNM for review  Hello Dr. Darcel Bayley,  No real question but wanted to follow-up with what I have learned about my mother's ovarian cancer. 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 She is taking Carboplatin & Taxol for chemotherapy.     Thank you!  Jasmin Duran

## 2018-09-16 ENCOUNTER — Ambulatory Visit: Payer: Self-pay | Admitting: Family Medicine

## 2018-09-29 NOTE — Telephone Encounter (Signed)
I had sent her a My Chart message, but looks like it has not been read.  We need to try to contact her with this information. Sending to Triage

## 2018-09-29 NOTE — Telephone Encounter (Signed)
Routing to the provider for review. Okay to close encounter or is further follow up needed?

## 2018-09-30 NOTE — Telephone Encounter (Signed)
Left message to call Linzie Criss, RN at [facility] [phone].   

## 2018-09-30 NOTE — Telephone Encounter (Signed)
Verner Chol, CNM  to [place]      09/17/18 12:35 PM  Hi Adella Nissen,  Thank you for the update on your mother's cancer diagnosis. Current literature suggest PUS and Ca 125 are the only options to screen and are not absolute. We can assist you with this if you would like to have done and can be done in office. Genetic screening is helpful and maybe something you want to consider, I can refer you for consultation locally.. My thoughts are with you and your mother during this time of her treatment. Please let me know if you feel you need more information.  Have a good day  Debbi

## 2018-09-30 NOTE — Telephone Encounter (Signed)
Spoke with patient, advised as seen below per Leota Sauers, CNM. Patient thankful for follow-up, declines referral to genetics or to schedule PUS at this time. Patient aware to return call to office when ready or with any additional questions.   Routing to provider FYI. Patient is agreeable to disposition. Will close encounter.

## 2018-10-21 ENCOUNTER — Other Ambulatory Visit: Payer: Self-pay | Admitting: Obstetrics and Gynecology

## 2018-10-21 DIAGNOSIS — Z3041 Encounter for surveillance of contraceptive pills: Secondary | ICD-10-CM

## 2019-01-26 ENCOUNTER — Telehealth: Payer: Self-pay | Admitting: Certified Nurse Midwife

## 2019-01-26 ENCOUNTER — Other Ambulatory Visit: Payer: Self-pay | Admitting: Certified Nurse Midwife

## 2019-01-26 DIAGNOSIS — Z1231 Encounter for screening mammogram for malignant neoplasm of breast: Secondary | ICD-10-CM

## 2019-01-26 DIAGNOSIS — Z8041 Family history of malignant neoplasm of ovary: Secondary | ICD-10-CM

## 2019-01-26 DIAGNOSIS — Z8481 Family history of carrier of genetic disease: Secondary | ICD-10-CM

## 2019-01-26 NOTE — Telephone Encounter (Signed)
Leota Sauers, CNM -ok proceed with referral to [facility] Genetics Counselor

## 2019-01-26 NOTE — Telephone Encounter (Signed)
Patient is calling regarding results from her mother's genetic testing.

## 2019-01-26 NOTE — Telephone Encounter (Signed)
Spoke with patient. Patient last seen for AEX 09/02/18, mother was newly diagnosed with ovarian cancer. Mother has recently had genetics testing, positive for BRCA 2 gene. Patient requesting to proceed with genetics counseling. Patient lives in [place], request location in Home. Advised I will review with Melvia Heaps, CNM and return call, patient agreeable.

## 2019-01-26 NOTE — Telephone Encounter (Signed)
Yes Ok to proceed with referral for genetic counseling in [place]

## 2019-01-27 NOTE — Telephone Encounter (Signed)
Call to patient, advised as seen below. Patient request to proceed with referral to [facility] genetics counselor, schedule with first available provider. Advised patient our office referral coordinator will f/u with appt details once scheduled. Patient verbalizes understanding and is agreeable.   Routing to [employer].  Cc: Leota Sauers, CNM  Encounter closed.

## 2019-01-27 NOTE — Telephone Encounter (Signed)
Spoke with [facility] [place] [facility]. Was advised genetics counselor are [employer] and [employer]. Fax referral to [phone]. First available appt June 2020.

## 2019-09-04 ENCOUNTER — Other Ambulatory Visit: Payer: Self-pay

## 2019-09-07 NOTE — Progress Notes (Signed)
48 y.o. G0P0 Married  Caucasian Fe here for annual exam.  Contraception Nuvaring working well. Had weight gain while Covid adjustment and now working on. Seeing PCP Dr. Derrek Monaco for labs and medication management of Klonopin and Albuterol. Recent aex with labs, cholesterol elevated, but working on. Periods regular and light. No warning signs noted with use. Mother diagnosed with Ovarian cancer in 2019,was remission and now has in abdominal wall. Patient was scheduled for genetic evaluation and has been rescheduled for 10/2019.  Considering Ca 125 and PUS. No other health issues today. Mammogram today. 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  LMP 08/25/2019          Sexually active: Yes.    The current method of family planning is NuvaRing vaginal inserts.    Exercising: No.  exercise Smoker:  no  Review of Systems  Constitutional: Negative.   HENT: Negative.   Eyes: Negative.   Respiratory: Negative.   Cardiovascular: Negative.   Gastrointestinal: Negative.   Genitourinary: Negative.   Musculoskeletal: Negative.   Skin: Negative.   Neurological: Negative.   Endo/Heme/Allergies: Negative.   Psychiatric/Behavioral: Negative.     Health Maintenance: Pap:  08-30-17 neg HPV HR neg History of Abnormal Pap: yes MMG:  09-02-18 category b density birads 1:neg,will do today Self Breast exams: no Colonoscopy:  none BMD:   none TDaP:  2011 Shingles: no Pneumonia: no Hep C and HIV: HIV neg 2015 Labs: if needed   reports that she has never smoked. She has never used smokeless tobacco. She reports that she does not drink alcohol or use drugs.  Past Medical History:  Diagnosis Date  . Abnormal Pap smear of cervix    8/16 ASCUS HPV HR +  . Anxiety   . Cold induced bronchospasm   . Insomnia     Past Surgical History:  Procedure Laterality Date  . WISDOM TOOTH EXTRACTION      Current Outpatient Medications  Medication Sig Dispense Refill  . albuterol (PROVENTIL HFA;VENTOLIN  HFA) 108 (90 Base) MCG/ACT inhaler Inhale 2 puffs, 15 minutes before physical activity 1 Inhaler 2  . clonazePAM (KLONOPIN) 1 MG tablet Take 1 tablet (1 mg total) by mouth at bedtime as needed for anxiety. (Patient taking differently: Take 1 mg by mouth daily. ) 90 tablet 1  . etonogestrel-ethinyl estradiol (NUVARING) 0.12-0.015 MG/24HR vaginal ring INSERT 1 RING VAGINALLY AND LEAVE IN PLACE FOR 4 CONSECUTIVE WEEKS, THEN REMOVE FOR 3 DAYS 1 each 12   No current facility-administered medications for this visit.     Family History  Problem Relation Age of Onset  . Breast cancer Maternal Grandmother   . Cancer Maternal Grandfather        lung cancer  . Asthma Father   . Asthma Paternal Grandfather   . Asthma Maternal Aunt   . Ovarian cancer Mother        stage 4    ROS:  Pertinent items are noted in HPI.  Otherwise, a comprehensive ROS was negative.  Exam:   There were no vitals taken for this visit.   Ht Readings from Last 3 Encounters:  09/02/18 5\' 7"  (1.702 m)  02/10/18 5\' 8"  (1.727 m)  01/07/18 5\' 8"  (1.727 m)    General appearance: alert, cooperative and appears stated age Head: Normocephalic, without obvious abnormality, atraumatic Neck: no adenopathy, supple, symmetrical, trachea midline and thyroid normal to inspection and palpation Lungs: clear to auscultation bilaterally Breasts: normal appearance, no masses or tenderness, No nipple retraction or  dimpling, No nipple discharge or bleeding, No axillary or supraclavicular adenopathy Heart: regular rate and rhythm Abdomen: soft, non-tender; no masses,  no organomegaly Extremities: extremities normal, atraumatic, no cyanosis or edema Skin: Skin color, texture, turgor normal. No rashes or lesions Lymph nodes: Cervical, supraclavicular, and axillary nodes normal. No abnormal inguinal nodes palpated Neurologic: Grossly normal   Pelvic: External genitalia:  no lesions, normal female              Urethra:  normal appearing  urethra with no masses, tenderness or lesions              Bartholin's and Skene's: normal                 Vagina: normal appearing vagina with normal color and discharge, no lesions              Cervix: no cervical motion tenderness, no lesions and normal appearance              Pap taken: Yes.   Bimanual Exam:  Uterus:  normal size, contour, position, consistency, mobility, non-tender and anteverted              Adnexa: normal adnexa and no mass, fullness, tenderness               Rectovaginal: Confirms               Anus:  normal sphincter tone, no lesions  Chaperone present: yes  A:  Well Woman with normal exam  Contraception Nuvaring, desires continuance  Klonopin management with PCP  Family history of ovarian cancer with mother and Braca gene also  Mammogram today  P:   Reviewed health and wellness pertinent to exam  Discussed risks/benefits/warning signs with use.  Rx Nuvaring see order with instructions  Continue follow up with PCP as indicated  Discussed CA125 and PUS limitations, but does give information regarding physical findings. Patient would like to schedule. She has genetic counseling in 10/2019( rescheduled from earlier,limited availability). She will be called with insurance information with PUS and scheduled. She will have CA 125 at visit. Questions addressed.  Pap smear: yes   counseled on breast self exam, mammography screening, adequate intake of calcium and vitamin D, diet and exercise  return annually or prn  An After Visit Summary was printed and given to the patient.

## 2019-09-08 ENCOUNTER — Ambulatory Visit (INDEPENDENT_AMBULATORY_CARE_PROVIDER_SITE_OTHER): Payer: Managed Care, Other (non HMO) | Admitting: Certified Nurse Midwife

## 2019-09-08 ENCOUNTER — Other Ambulatory Visit (HOSPITAL_COMMUNITY)
Admission: RE | Admit: 2019-09-08 | Discharge: 2019-09-08 | Disposition: A | Discharge status: Home / Self Care | Payer: Managed Care, Other (non HMO) | Source: Ambulatory Visit | Attending: Certified Nurse Midwife | Admitting: Certified Nurse Midwife

## 2019-09-08 ENCOUNTER — Encounter: Payer: Self-pay | Admitting: Certified Nurse Midwife

## 2019-09-08 ENCOUNTER — Other Ambulatory Visit: Payer: Self-pay

## 2019-09-08 ENCOUNTER — Telehealth: Payer: Self-pay | Admitting: *Deleted

## 2019-09-08 ENCOUNTER — Ambulatory Visit
Admission: RE | Admit: 2019-09-08 | Discharge: 2019-09-08 | Disposition: A | Discharge status: Home / Self Care | Payer: Managed Care, Other (non HMO) | Source: Ambulatory Visit | Attending: Certified Nurse Midwife | Admitting: Certified Nurse Midwife

## 2019-09-08 VITALS — BP 110/70 | HR 68 | Temp 97.2°F | Resp 16 | Ht 67.25 in | Wt 169.0 lb

## 2019-09-08 DIAGNOSIS — Z124 Encounter for screening for malignant neoplasm of cervix: Secondary | ICD-10-CM | POA: Diagnosis present

## 2019-09-08 DIAGNOSIS — Z1231 Encounter for screening mammogram for malignant neoplasm of breast: Secondary | ICD-10-CM

## 2019-09-08 DIAGNOSIS — Z3044 Encounter for surveillance of vaginal ring hormonal contraceptive device: Secondary | ICD-10-CM

## 2019-09-08 DIAGNOSIS — Z01419 Encounter for gynecological examination (general) (routine) without abnormal findings: Secondary | ICD-10-CM | POA: Diagnosis not present

## 2019-09-08 DIAGNOSIS — Z8041 Family history of malignant neoplasm of ovary: Secondary | ICD-10-CM

## 2019-09-08 DIAGNOSIS — Z8481 Family history of carrier of genetic disease: Secondary | ICD-10-CM

## 2019-09-08 IMAGING — MG MM DIGITAL SCREENING BILAT W/ TOMO W/ CAD
8 series · 8 of 24 positions shown · non-contrast
Comparison: Previous exam(s).

CLINICAL DATA: Screening.

EXAM:
DIGITAL SCREENING BILATERAL MAMMOGRAM WITH TOMO AND CAD

[R CC synth-2D]
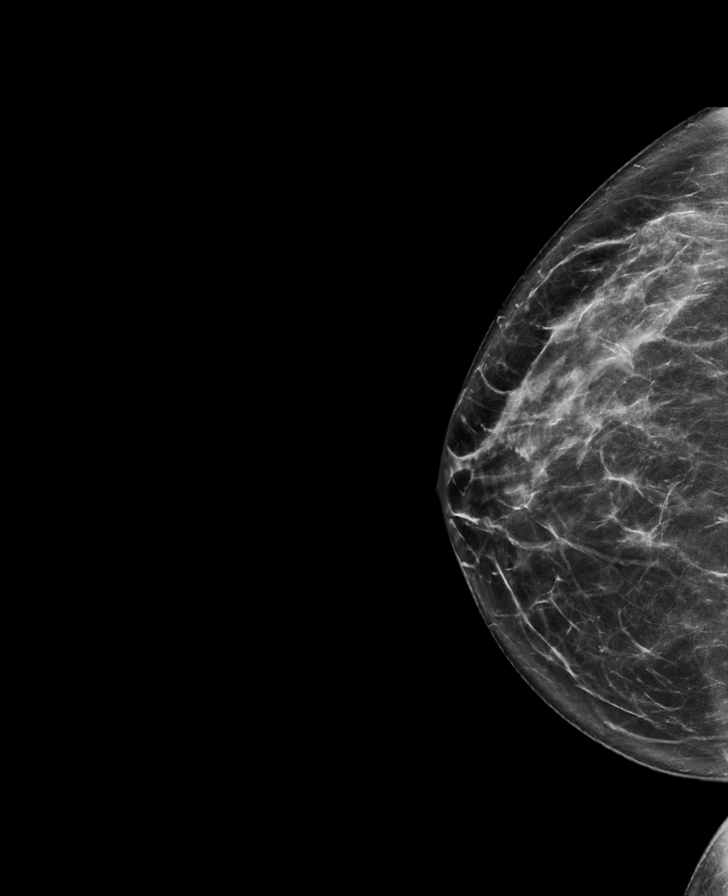

[L CC synth-2D]
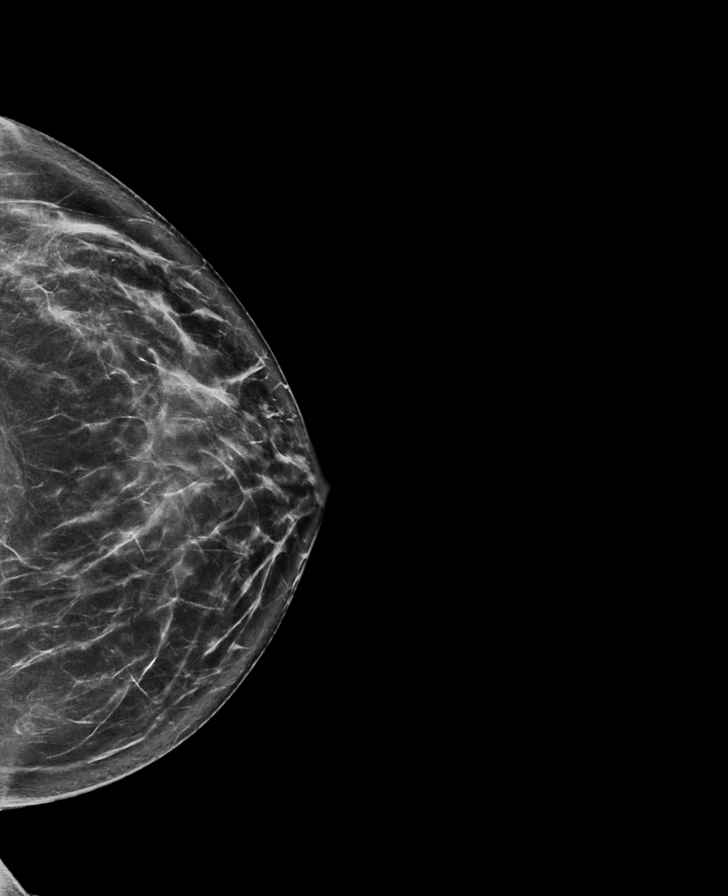

[L MLO synth-2D]
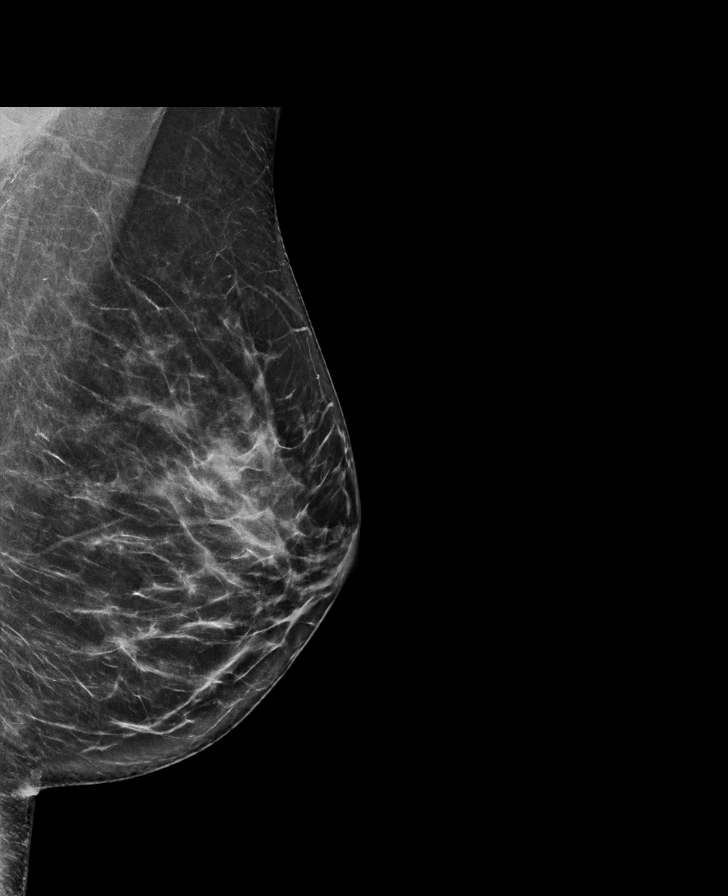

[R MLO synth-2D]
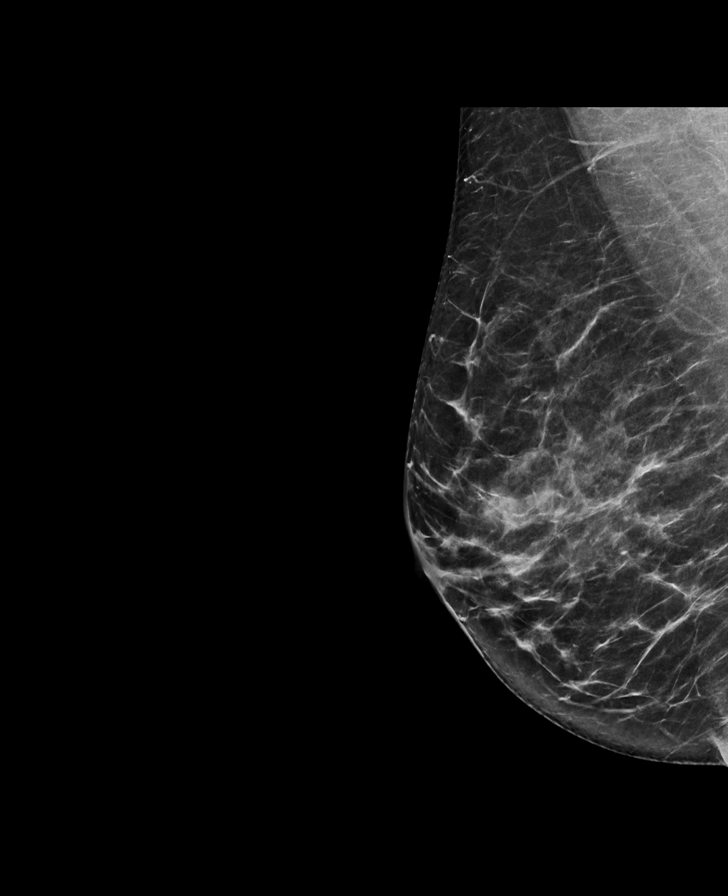

[R CC tomo · tomo slice 39/76.0]
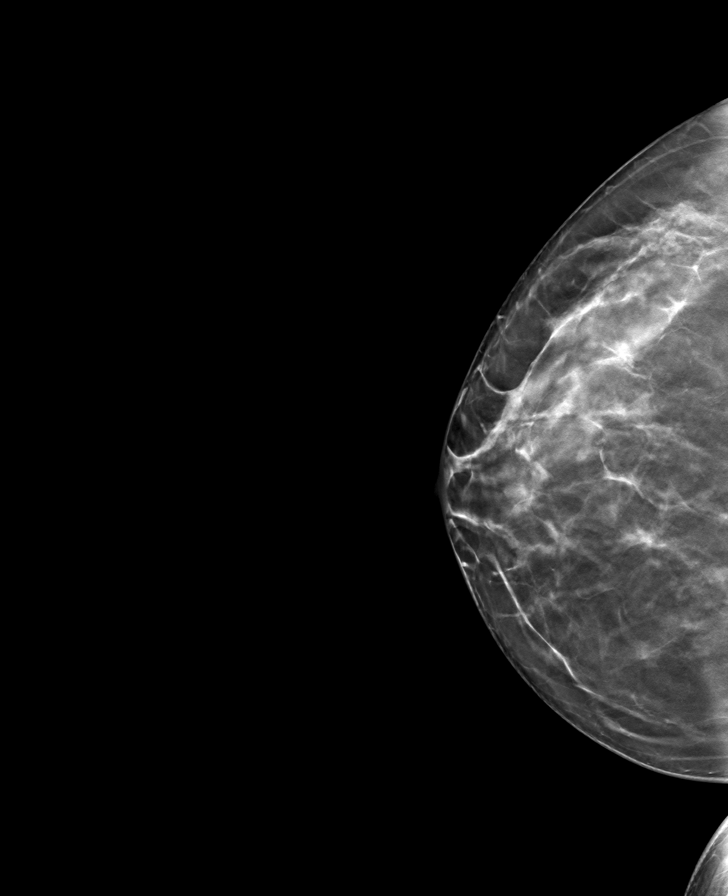

[R MLO tomo · tomo slice 39/76.0]
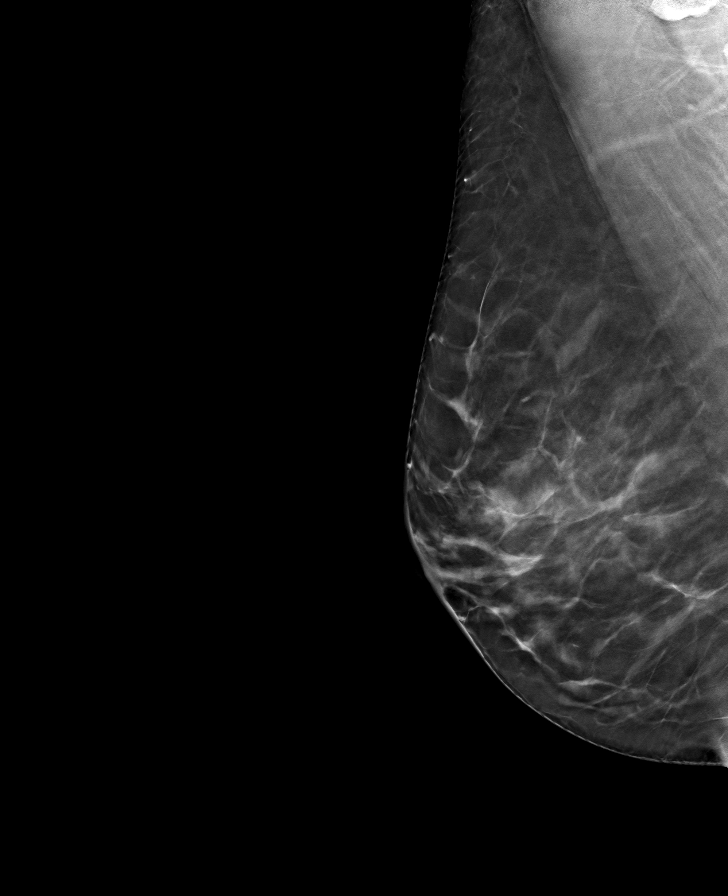

[L CC tomo · tomo slice 40/79.0]
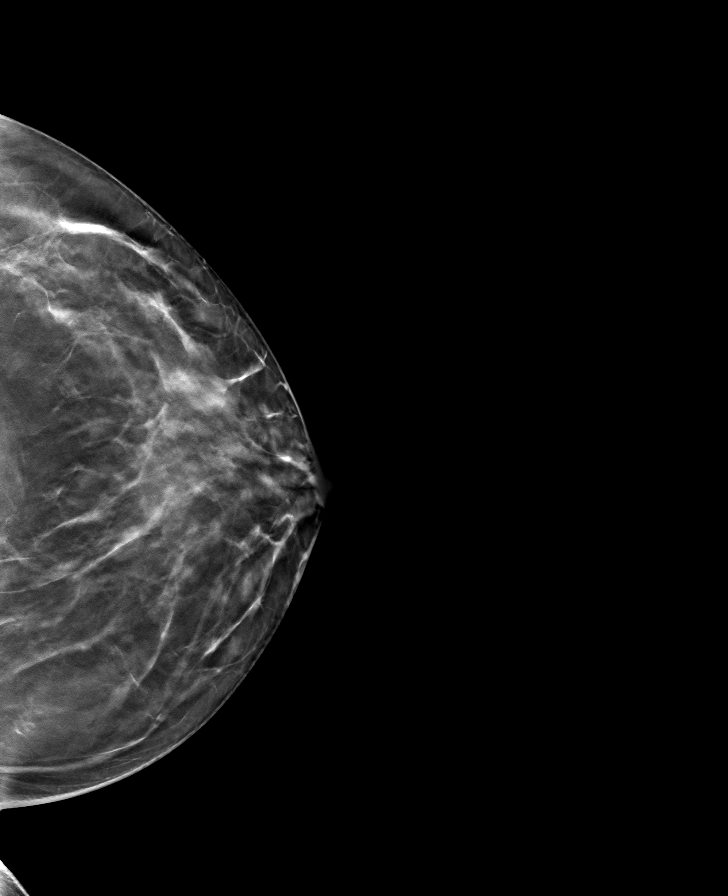

[L MLO tomo · tomo slice 39/78.0]
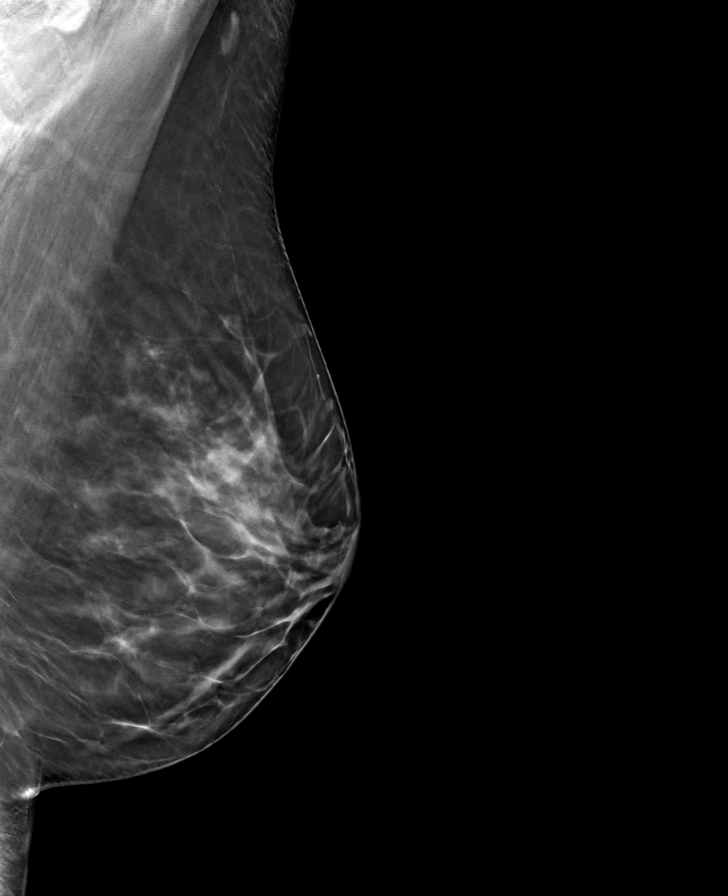

[8 of 24 positions shown; findings below may reference images not displayed]

ACR Breast Density Category c: The breast tissue is heterogeneously
dense, which may obscure small masses.
FINDINGS: There are no findings suspicious for malignancy. Images were
processed with CAD.
IMPRESSION: No mammographic evidence of malignancy. A result letter of this
screening mammogram will be mailed directly to the patient.

RECOMMENDATION:
Screening mammogram in one year. (Code:[5V])

BI-RADS CATEGORY  1: Negative.

## 2019-09-08 MED ORDER — ETONOGESTREL-ETHINYL ESTRADIOL 0.12-0.015 MG/24HR VA RING: VAGINAL_RING | VAGINAL | 12 refills | Status: DC

## 2019-09-08 NOTE — Patient Instructions (Signed)
EXERCISE AND DIET:  We recommended that you start or continue a regular exercise program for good health. Regular exercise means any activity that makes your heart beat faster and makes you sweat.  We recommend exercising at least 30 minutes per day at least 3 days a week, preferably 4 or 5.  We also recommend a diet low in fat and sugar.  Inactivity, poor dietary choices and obesity can cause diabetes, heart attack, stroke, and kidney damage, among others.   ° °ALCOHOL AND SMOKING:  Women should limit their alcohol intake to no more than 7 drinks/beers/glasses of wine (combined, not each!) per week. Moderation of alcohol intake to this level decreases your risk of breast cancer and liver damage. And of course, no recreational drugs are part of a healthy lifestyle.  And absolutely no smoking or even second hand smoke. Most people know smoking can cause heart and lung diseases, but did you know it also contributes to weakening of your bones? Aging of your skin?  Yellowing of your teeth and nails? ° °CALCIUM AND VITAMIN D:  Adequate intake of calcium and Vitamin D are recommended.  The recommendations for exact amounts of these supplements seem to change often, but generally speaking 600 mg of calcium (either carbonate or citrate) and 800 units of Vitamin D per day seems prudent. Certain women may benefit from higher intake of Vitamin D.  If you are among these women, your doctor will have told you during your visit.   ° °PAP SMEARS:  Pap smears, to check for cervical cancer or precancers,  have traditionally been done yearly, although recent scientific advances have shown that most women can have pap smears less often.  However, every woman still should have a physical exam from her gynecologist every year. It will include a breast check, inspection of the vulva and vagina to check for abnormal growths or skin changes, a visual exam of the cervix, and then an exam to evaluate the size and shape of the uterus and  ovaries.  And after 48 years of age, a rectal exam is indicated to check for rectal cancers. We will also provide age appropriate advice regarding health maintenance, like when you should have certain vaccines, screening for sexually transmitted diseases, bone density testing, colonoscopy, mammograms, etc.  ° °MAMMOGRAMS:  All women over 40 years old should have a yearly mammogram. Many facilities now offer a "3D" mammogram, which may cost around $50 extra out of pocket. If possible,  we recommend you accept the option to have the 3D mammogram performed.  It both reduces the number of women who will be called back for extra views which then turn out to be normal, and it is better than the routine mammogram at detecting truly abnormal areas.   ° °COLONOSCOPY:  Colonoscopy to screen for colon cancer is recommended for all women at age 48.  We know, you hate the idea of the prep.  We agree, BUT, having colon cancer and not knowing it is worse!!  Colon cancer so often starts as a polyp that can be seen and removed at colonscopy, which can quite literally save your life!  And if your first colonoscopy is normal and you have no family history of colon cancer, most women don't have to have it again for 10 years.  Once every ten years, you can do something that may end up saving your life, right?  We will be happy to help you get it scheduled when you are ready.    Be sure to check your insurance coverage so you understand how much it will cost.  It may be covered as a preventative service at no cost, but you should check your particular policy.      CA-125 Tumor Marker Test Why am I having this test? The CA-125 tumor marker test is generally done for the following reasons:  To screen for ovarian cancer. High levels of the protein called cancer antigen-125 (CA-125) can be a sign of ovarian cancer. However, many conditions other than cancer can cause high levels of CA-125. Because of this, the test has not been found  to be useful for ovarian cancer screening. Generally, this test is done only if you are at high risk for this type of cancer. You are at high risk if: ? You have a strong family history of ovarian cancer. ? You have a mutation in one of two genes: breast cancer gene 1 (BRCA1) or breast cancer gene 2 (BRCA2).  To identify the extent of the disease and to monitor your response to treatment. The test is done after you have been diagnosed with ovarian cancer.  To check to see if ovarian cancer has returned (recurred). The test may be done after you have completed initial treatment for ovarian cancer to check for recurrence. What is being tested? This test measures the level of CA-125 in your blood. What kind of sample is taken?  A blood sample is required for this test. It is usually collected by inserting a needle into a blood vessel. How are the results reported? Your test results will be reported as values. Your health care provider will compare your results to normal ranges that were established after testing a large group of people (reference ranges). Reference ranges may vary among labs and hospitals. For this test, a common reference range is:  0-35 units/mL or less than 35 kunits/L (SI units). What do the results mean? Increased levels of CA-125 may be seen in:  Certain types of cancer, including: ? Ovarian cancer. ? Endometrial cancer. ? Peritoneal cancer. ? Fallopian tube cancer. ? Pancreatic cancer. ? Colon cancer. ? Lung cancer. ? Breast cancer. ? Lymphoma.  Noncancerous (benign) disorders or conditions, including: ? Cirrhosis. ? Pregnancy. ? Endometriosis. ? Pancreatitis. ? Uterine fibroids. ? Pelvic inflammatory disease (PID). Talk with your health care provider about what your results mean. Questions to ask your health care provider Ask your health care provider, or the department that is doing the test:  When will my results be ready?  How will I get my  results?  What are my treatment options?  What other tests do I need?  What are my next steps? Summary  The CA-125 tumor marker test is generally done to screen for ovarian cancer in those who are at risk for it and to monitor treatment in those who have been diagnosed with it.  Increased levels of CA-125 in your blood may mean that you have certain diseases and conditions, including cancer, cirrhosis, or even pregnancy. Follow-up tests may be needed.  Talk with your health care provider about what your results mean. This information is not intended to replace advice given to you by your health care provider. Make sure you discuss any questions you have with your health care provider. Document Released: 01/01/2005 Document Revised: 11/22/2017 Document Reviewed: 08/15/2017 Elsevier Patient Education  [place]. Ethinyl Estradiol; Etonogestrel vaginal ring What is this medicine? ETHINYL ESTRADIOL; ETONOGESTREL (ETH in il es tra DYE ole; et oh  noe JES trel) vaginal ring is a flexible, vaginal ring used as a contraceptive (birth control method). This medicine combines 2 types of female hormones, an estrogen and a progestin. This ring is used to prevent ovulation and pregnancy. Each ring is effective for 1 month. This medicine may be used for other purposes; ask your health care provider or pharmacist if you have questions. COMMON BRAND NAME(S): EluRyng, NuvaRing What should I tell my health care provider before I take this medicine? They need to know if you have any of these conditions:  abnormal vaginal bleeding  blood vessel disease or blood clots  breast, cervical, endometrial, ovarian, liver, or uterine cancer  diabetes  gallbladder disease  having surgery  heart disease or recent heart attack  high blood pressure  high cholesterol or triglycerides  history of irregular heartbeat or heart valve problems  kidney disease  liver disease  migraine  headaches  protein C deficiency  protein S deficiency  recently had a baby, miscarriage, or abortion  stroke  systemic lupus erythematosus (SLE)  tobacco smoker  your age is more than 48 years old  an unusual or allergic reaction to estrogens, progestins, other medicines, foods, dyes, or preservatives  pregnant or trying to get pregnant  breast-feeding How should I use this medicine? Insert the ring into your vagina as directed. Follow the directions on the prescription label. The ring will remain place for 3 weeks and is then removed for a 1-week break. A new ring is inserted 1 week after the last ring was removed, on the same day of the week. Check often to make sure the ring is still in place. If the ring was out of the vagina for an unknown amount of time, you may not be protected from pregnancy. Perform a pregnancy test and call your doctor. Do not use more often than directed. A patient package insert for the product will be given with each prescription and refill. Read this sheet carefully each time. The sheet may change frequently. Contact your pediatrician regarding the use of this medicine in children. Special care may be needed. Overdosage: If you think you have taken too much of this medicine contact a poison control center or emergency room at once. NOTE: This medicine is only for you. Do not share this medicine with others. What if I miss a dose? You will need to use the ring exactly as directed. It is very important to follow the schedule every cycle. If you do not use the ring as directed, you may not be protected from pregnancy. If the ring should slip out, is lost, or if you leave it in longer or shorter than you should, contact your health care professional for advice. What may interact with this medicine? Do not take this medicine with the following medications:  dasabuvir; ombitasvir; paritaprevir; ritonavir  ombitasvir; paritaprevir; ritonavir  vaginal  lubricants or other vaginal products that are oil-based or silicone-based This medicine may also interact with the following medications:  acetaminophen  antibiotics or medicines for infections, especially rifampin, rifabutin, rifapentine, and griseofulvin, and possibly penicillins or tetracyclines  aprepitant or fosaprepitant  armodafinil  ascorbic acid (vitamin C)  barbiturate medicines, such as phenobarbital or primidone  bosentan  certain antiviral medicines for hepatitis, HIV or AIDS  certain medicines for cancer treatment  certain medicines for seizures like carbamazepine, clobazam, felbamate, lamotrigine, oxcarbazepine, phenytoin, rufinamide, topiramate  certain medicines for treating high cholesterol  cyclosporine  dantrolene  elagolix  flibanserin  grapefruit juice  lesinurad  medicines for diabetes  medicines to treat fungal infections, such as griseofulvin, miconazole, fluconazole, ketoconazole, itraconazole, posaconazole or voriconazole  mifepristone  mitotane  modafinil  morphine  mycophenolate  St. John's wort  tamoxifen  temazepam  theophylline or aminophylline  thyroid hormones  tizanidine  tranexamic acid  ulipristal  warfarin This list may not describe all possible interactions. Give your health care provider a list of all the medicines, herbs, non-prescription drugs, or dietary supplements you use. Also tell them if you smoke, drink alcohol, or use illegal drugs. Some items may interact with your medicine. What should I watch for while using this medicine? Visit your doctor or health care professional for regular checks on your progress. You will need a regular breast and pelvic exam and Pap smear while on this medicine. Check with your doctor or health care professional to see if you need an additional method of contraception during the first cycle that you use this ring. Female condoms (made with natural rubber latex,  polyisoprene, and polyurethane) and spermicides may be used. Do not use a diaphragm, cervical cap, or a female condom, as the ring can interfere with these birth control methods and their proper placement. If you have any reason to think you are pregnant, stop using this medicine right away and contact your doctor or health care professional. If you are using this medicine for hormone related problems, it may take several cycles of use to see improvement in your condition. Smoking increases the risk of getting a blood clot or having a stroke while you are using hormonal birth control, especially if you are more than 48 years old. You are strongly advised not to smoke. Some women are prone to getting dark patches on the skin of the face (cholasma). Your risk of getting chloasma with this medicine is higher if you had chloasma during a pregnancy. Keep out of the sun. If you cannot avoid being in the sun, wear protective clothing and use sunscreen. Do not use sun lamps or tanning beds/booths. This medicine can make your body retain fluid, making your fingers, hands, or ankles swell. Your blood pressure can go up. Contact your doctor or health care professional if you feel you are retaining fluid. If you are going to have elective surgery, you may need to stop using this medicine before the surgery. Consult your health care professional for advice. This medicine does not protect you against HIV infection (AIDS) or any other sexually transmitted diseases. What side effects may I notice from receiving this medicine? Side effects that you should report to your doctor or health care professional as soon as possible:  allergic reactions such as skin rash or itching, hives, swelling of the lips, mouth, tongue, or throat  depression  high blood pressure  migraines or severe, sudden headaches  signs and symptoms of a blood clot such as breathing problems; changes in vision; chest pain; severe, sudden  headache; pain, swelling, warmth in the leg; trouble speaking; sudden numbness or weakness of the face, arm or leg  signs and symptoms of infection like fever or chills with dizziness and a sunburn-like rash, or pain or trouble passing urine  stomach pain  symptoms of vaginal infection like itching, irritation or unusual discharge  yellowing of the eyes or skin Side effects that usually do not require medical attention (report these to your doctor or health care professional if they continue or are bothersome):  acne  breast pain, tenderness  irregular vaginal bleeding or  spotting, particularly during the first month of use  mild headache  nausea  painful periods  vomiting This list may not describe all possible side effects. Call your doctor for medical advice about side effects. You may report side effects to FDA at 1-800-FDA-1088. Where should I keep my medicine? Keep out of the reach of children. Store unopened rings in the original foil pouch at room temperature between 20 and 25 degrees C (68 and 77 degrees F) for up to 4 months. Protect from light. Do not store above 30 degrees C (86 degrees F). Throw away any unused medicine after the expiration date. A ring may only be used for 1 cycle (1 month). After the 3-week cycle, a used ring is removed and should be placed in the re-closable foil pouch and discarded in the trash out of reach of children and pets. Do NOT flush down the toilet. NOTE: This sheet is a summary. It may not cover all possible information. If you have questions about this medicine, talk to your doctor, pharmacist, or health care provider.  2020 Elsevier/Gold Standard (2017-08-09 14:41:10)

## 2019-09-08 NOTE — Telephone Encounter (Signed)
-----   Message from Regina Eck, CNM sent at 09/08/2019  9:02 AM EDT ----- Patient would like to do PUS for evaluation due to mother with Ovarian cancer. She also will do Ca 125 at that visit. Her mother does have Braca gene. Aware she will be called with insurance information and scheduled.

## 2019-09-08 NOTE — Telephone Encounter (Signed)
Spoke with patient. PUS scheduled for 9/22 at 2pm, consult at 2:30pm with Dr. Talbert Nan.   Order placed for precert.  Future lab order placed.   Patient verbalizes understanding and is agreeable.   Routing to provider for final review. Patient is agreeable to disposition. Will close encounter.  Cc: Dr. Talbert Nan, [place], [place]

## 2019-09-08 NOTE — Addendum Note (Signed)
Addended by: Regina Eck on: 09/08/2019 09:46 AM   Modules accepted: Orders

## 2019-09-10 LAB — CYTOLOGY - PAP
Diagnosis: NEGATIVE
High risk HPV: NEGATIVE
Molecular Disclaimer: 56
Molecular Disclaimer: DETECTED
Molecular Disclaimer: NORMAL

## 2019-09-11 ENCOUNTER — Other Ambulatory Visit: Payer: Self-pay

## 2019-09-14 NOTE — Progress Notes (Signed)
GYNECOLOGY  VISIT   HPI: 48 y.o.   Married White or Caucasian Not Hispanic or Latino  female   G0P0 with Patient's last menstrual period was 08/25/2019 (exact date).   here for consult following PUS. The patient's mother had ovarian cancer in 2019, she was 79 at diagnosis, she is BRCA 2 +.  The patient is scheduled for genetic evaluation for 11/20.       GYNECOLOGIC HISTORY: Patient's last menstrual period was 08/25/2019 (exact date). Contraception:Nuvaring Menopausal hormone therapy: None        OB History    Gravida  0   Para      Term      Preterm      AB      Living        SAB      TAB      Ectopic      Multiple      Live Births                 Patient Active Problem List   Diagnosis Date Noted  . 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 Restless leg syndrome 02/13/2018  . Chronic use of benzodiazepine for therapeutic purpose 02/13/2018  . Elevated blood pressure reading 02/13/2018  . Primary insomnia 07/10/2013    Past Medical History:  Diagnosis Date  . Abnormal Pap smear of cervix    8/16 ASCUS HPV HR +  . Anxiety   . Cold induced bronchospasm   . Insomnia     Past Surgical History:  Procedure Laterality Date  . WISDOM TOOTH EXTRACTION      Current Outpatient Medications  Medication Sig Dispense Refill  . albuterol (PROVENTIL HFA;VENTOLIN HFA) 108 (90 Base) MCG/ACT inhaler Inhale 2 puffs, 15 minutes before physical activity 1 Inhaler 2  . clonazePAM (KLONOPIN) 0.5 MG tablet     . etonogestrel-ethinyl estradiol (NUVARING) 0.12-0.015 MG/24HR vaginal ring INSERT 1 RING VAGINALLY AND LEAVE IN PLACE FOR 4 CONSECUTIVE WEEKS, THEN REMOVE FOR 3 DAYS 1 each 12   No current facility-administered medications for this visit.      ALLERGIES: Patient has no known allergies.  Family History  Problem Relation Age of Onset  . Breast cancer Maternal Grandmother   . Cancer Maternal Grandfather        lung cancer  . Asthma Father   . Asthma  Paternal Grandfather   . Asthma Maternal Aunt   . Ovarian cancer Mother        stage 79  . Cancer Mother        stomach wall    Social History   Socioeconomic History  . Marital status: Married    Spouse name: Not on file  . Number of children: Not on file  . Years of education: Not on file  . Highest education level: Not on file  Occupational History  . Not on file  Social Needs  . Financial resource strain: Not on file  . Food insecurity    Worry: Not on file    Inability: Not on file  . Transportation needs    Medical: Not on file    Non-medical: Not on file  Tobacco Use  . Smoking status: Never Smoker  . Smokeless tobacco: Never Used  Substance and Sexual Activity  . Alcohol use: No  . Drug use: No  . Sexual activity: Yes    Partners: Male    Birth control/protection: Inserts    Comment: nuvaring  Lifestyle  .  Physical activity    Days per week: Not on file    Minutes per session: Not on file  . Stress: Not on file  Relationships  . Social Herbalist on phone: Not on file    Gets together: Not on file    Attends religious service: Not on file    Active member of club or organization: Not on file    Attends meetings of clubs or organizations: Not on file    Relationship status: Not on file  . Intimate partner violence    Fear of current or ex partner: Not on file    Emotionally abused: Not on file    Physically abused: Not on file    Forced sexual activity: Not on file  Other Topics Concern  . Not on file  Social History Narrative  . Not on file    Review of Systems  Constitutional: Negative.   HENT: Negative.   Eyes: Negative.   Respiratory: Negative.   Cardiovascular: Negative.   Gastrointestinal: Negative.   Genitourinary: Negative.   Musculoskeletal: Negative.   Skin: Negative.   Neurological: Negative.   Endo/Heme/Allergies: Negative.   Psychiatric/Behavioral: Negative.     PHYSICAL EXAMINATION:    BP 140/88 (BP Location:  Right Arm, Patient Position: Sitting, Cuff Size: Normal)   Pulse 80   Temp (!) 97.1 F (36.2 C) (Skin)   Wt 169 lb (76.7 kg)   LMP 08/25/2019 (Exact Date)   BMI 26.27 kg/m     General appearance: alert, cooperative and appears stated age  Ultrasound images reviewed with the patient. Normal ultrasound.  ASSESSMENT Mom with ovarian cancer Mom BRCA 2 +    PLAN She has an appointment to see a Dietitian in [place] in 11/20, she would prefer to move that up and have it here. Will schedule it for her. CA 125 today We briefly discussed that if she is BRCA 2+ she should have a BSO (+/- hysterectomy)   An After Visit Summary was printed and given to the patient.

## 2019-09-15 ENCOUNTER — Ambulatory Visit (INDEPENDENT_AMBULATORY_CARE_PROVIDER_SITE_OTHER): Payer: Managed Care, Other (non HMO) | Admitting: Obstetrics and Gynecology

## 2019-09-15 ENCOUNTER — Other Ambulatory Visit: Payer: Self-pay

## 2019-09-15 ENCOUNTER — Encounter: Payer: Self-pay | Admitting: Obstetrics and Gynecology

## 2019-09-15 ENCOUNTER — Telehealth: Payer: Self-pay | Admitting: Genetic Counselor

## 2019-09-15 ENCOUNTER — Ambulatory Visit (INDEPENDENT_AMBULATORY_CARE_PROVIDER_SITE_OTHER): Payer: Managed Care, Other (non HMO)

## 2019-09-15 VITALS — BP 140/88 | HR 80 | Temp 97.1°F | Wt 169.0 lb

## 2019-09-15 DIAGNOSIS — Z8041 Family history of malignant neoplasm of ovary: Secondary | ICD-10-CM | POA: Diagnosis not present

## 2019-09-15 DIAGNOSIS — Z8481 Family history of carrier of genetic disease: Secondary | ICD-10-CM | POA: Diagnosis not present

## 2019-09-15 NOTE — Telephone Encounter (Signed)
Received a call from [facility] at [facility] to schedule a genetic counseling appt for fhx of breast cancer. MS. Caruthers has been scheduled to see Roma Kayser on 9/23 at 2pm. Verline Lema will notify Ms. Krzyzanowski of the appt date and time.

## 2019-09-16 ENCOUNTER — Other Ambulatory Visit: Payer: Self-pay

## 2019-09-16 ENCOUNTER — Inpatient Hospital Stay: Payer: Managed Care, Other (non HMO) | Attending: Genetic Counselor | Admitting: Genetic Counselor

## 2019-09-16 ENCOUNTER — Inpatient Hospital Stay: Payer: Managed Care, Other (non HMO)

## 2019-09-16 ENCOUNTER — Other Ambulatory Visit: Payer: Self-pay | Admitting: Genetic Counselor

## 2019-09-16 ENCOUNTER — Encounter: Payer: Self-pay | Admitting: Genetic Counselor

## 2019-09-16 DIAGNOSIS — Z8041 Family history of malignant neoplasm of ovary: Secondary | ICD-10-CM

## 2019-09-16 DIAGNOSIS — Z803 Family history of malignant neoplasm of breast: Secondary | ICD-10-CM | POA: Diagnosis not present

## 2019-09-16 DIAGNOSIS — Z8481 Family history of carrier of genetic disease: Secondary | ICD-10-CM

## 2019-09-16 LAB — CA 125: Cancer Antigen (CA) 125: 14 U/mL (ref 0.0–38.1)

## 2019-09-16 NOTE — Progress Notes (Signed)
REFERRING PROVIDER: Salvadore Dom, [address] [place],  [place] [address]  PRIMARY PROVIDER:  Derenda Mis, MD  PRIMARY REASON FOR VISIT:  1. Family history of breast cancer   2. Family history of ovarian cancer   3. Family history of BRCA2 gene positive      HISTORY OF PRESENT ILLNESS:   Ms. Jasmin Duran, a 48 y.o. female, was seen for a [place] cancer genetics consultation at the request of Dr. Talbert Nan due to a family history of breast and ovarian cancer, and a known familial BRCA2 mutation.  Ms. [name] presents to clinic today to discuss the possibility of a hereditary predisposition to cancer, genetic testing, and to further clarify her future cancer risks, as well as potential cancer risks for family members.   Ms. [name] is a 48 y.o. female with no personal history of cancer.  She is very concerned about the risk for a BRCA2 mutation.  CANCER HISTORY:  Oncology History   No history exists.     RISK FACTORS:  Menarche was at age 46.  First live birth at age N/A.  OCP use for approximately 22 years.  Ovaries intact: yes.  Hysterectomy: no.  Menopausal status: premenopausal.  HRT use: 0 years. Colonoscopy: no; not examined. Mammogram within the last year: yes. Number of breast biopsies: 0. Up to date with pelvic exams: yes. Any excessive radiation exposure in the past: no  Past Medical History:  Diagnosis Date  . Abnormal Pap smear of cervix    8/16 ASCUS HPV HR +  . Anxiety   . Cold induced bronchospasm   . Family history of BRCA2 gene positive   . Family history of breast cancer   . Family history of ovarian cancer   . Insomnia     Past Surgical History:  Procedure Laterality Date  . WISDOM TOOTH EXTRACTION      Social History   Socioeconomic History  . Marital status: Married    Spouse name: Not on file  . Number of children: Not on file  . Years of education: Not on file  . Highest education level: Not on file   Occupational History  . Not on file  Social Needs  . Financial resource strain: Not on file  . Food insecurity    Worry: Not on file    Inability: Not on file  . Transportation needs    Medical: Not on file    Non-medical: Not on file  Tobacco Use  . Smoking status: Never Smoker  . Smokeless tobacco: Never Used  Substance and Sexual Activity  . Alcohol use: No  . Drug use: No  . Sexual activity: Yes    Partners: Male    Birth control/protection: Inserts    Comment: nuvaring  Lifestyle  . Physical activity    Days per week: Not on file    Minutes per session: Not on file  . Stress: Not on file  Relationships  . Social Herbalist on phone: Not on file    Gets together: Not on file    Attends religious service: Not on file    Active member of club or organization: Not on file    Attends meetings of clubs or organizations: Not on file    Relationship status: Not on file  Other Topics Concern  . Not on file  Social History Narrative  . Not on file     FAMILY HISTORY:  We obtained a  detailed, 4-generation family history.  Significant diagnoses are listed below: Family History  Problem Relation Age of Onset  . Breast cancer Maternal Grandmother        dx younger than 50  . Cancer Maternal Grandfather        lung cancer  . Asthma Father   . Asthma Paternal Grandfather   . Asthma Maternal Aunt   . Ovarian cancer Mother        stage 57  . Cancer Mother        stomach wall  . BRCA 1/2 Mother        BRCA2 +  . Cerebral palsy Sister   . COPD Paternal 59   . Asthma Paternal Aunt   . Breast cancer Other        d. 72  . Asthma Paternal Aunt     The patient does not have children.  She has one sister who is cancer free.  Her parents are both living.  The patient's mother has a history of melanoma and was diagnosed with ovarian cancer at 37.  She is an only child.  The maternal grandmother was diagnosed with breast cancer under age 36.  The grandfather had  lung cancer and also died under 69. He had a sister who had breast cancer.  The patient's father has asthma. He has two sisters who are cancer free, both have asthma, and one died of COPD.  The paternal grandparents are deceased from non-cancer related issues.  Ms. Toohey is unaware of previous family history of genetic testing for hereditary cancer risks. Patient's maternal ancestors are of [place], [place], and [place] descent, and paternal ancestors are of [place], [place] and Native American descent. There is no reported Ashkenazi Jewish ancestry. There is no known consanguinity.  GENETIC COUNSELING ASSESSMENT: Ms. Dario is a 48 y.o. female with a family history of breast and ovarian cancer which is somewhat suggestive of a hereditary cancer syndrome and predisposition to cancer given the known BRCA2 mutation. We, therefore, discussed and recommended the following at today's visit.   DISCUSSION: We discussed that up to 20% of cancer is hereditary, with most cases associated with BRCA mutations.  The patient's mother was diagnosed with a BRCA2 mutation.  We discussed that BRCA2 mutations increase the risk for breast, ovarian, pancreatic and prostate cancer as well as melanoma.  There are certain options when a person is found to have a BRCA2 mutation, but do not have cancer.  We can do increased breast screening that includes breast MRI and mammogram or offer a mastectomy.  We always recommend a bilateral-salpingo-oophorectomy (BSO).  A BSO, if done premenopausally, can reduce the risk for breast cancer.  The patient has a 50% chance having inherited the BRCA2 mutation from her mother.  We discussed her 20+ years of OCP's, and that individuals who have been on OCP's for 5+ years seem to have a protective effect for ovarian cancer.  We reviewed that it does not mean that she would never get ovarian cancer, but that the studies found fewer cases among the population of 5+ years as well as older onset.    We discussed that testing is beneficial for several reasons including knowing how to follow individuals, identifying potential treatment options such as PARP inhibitors if she does develop cancer, and understand if other family members could be at risk for cancer and allow them to undergo genetic testing.   We reviewed the characteristics, features and inheritance patterns of hereditary cancer syndromes.  We also discussed genetic testing, including the appropriate family members to test, the process of testing, insurance coverage and turn-around-time for results. We discussed the implications of a negative, positive, carrier and/or variant of uncertain significant result. We recommended Ms. Tamera Punt pursue genetic testing for the CancerNext+RNAinsight gene panel. The CancerNext gene panel offered by [facility] includes sequencing and rearrangement analysis for the following 34 genes:   APC, ATM, BARD1, BMPR1A, BRCA1, BRCA2, BRIP1, CDH1, CDK4, CDKN2A, CHEK2, DICER1, HOXB13, EPCAM, GREM1, MLH1, MRE11A, MSH2, MSH6, MUTYH, NBN, NF1, PALB2, PMS2, POLD1, POLE, PTEN, RAD50, RAD51C, RAD51D, SMAD4, SMARCA4, STK11, and TP53.    Based on Ms. Fitzhenry's family history of cancer, she meets medical criteria for genetic testing. Despite that she meets criteria, she may still have an out of pocket cost. We discussed that if her out of pocket cost for testing is over $100, the laboratory will call and confirm whether she wants to proceed with testing.  If the out of pocket cost of testing is less than $100 she will be billed by the genetic testing laboratory.   PLAN: After considering the risks, benefits, and limitations, Ms. Tamera Punt provided informed consent to pursue genetic testing and the blood sample was sent to [facility] for analysis of the CancerNext+RNAinsight. Results should be available within approximately 2-3 weeks' time, at which point they will be disclosed by telephone to Ms. Tamera Punt, as  will any additional recommendations warranted by these results. Ms. Lenoir will receive a summary of her genetic counseling visit and a copy of her results once available. This information will also be available in Epic.   Lastly, we encouraged Ms. Reise to remain in contact with cancer genetics annually so that we can continuously update the family history and inform her of any changes in cancer genetics and testing that may be of benefit for this family.   Ms. Trovato questions were answered to her satisfaction today. Our contact information was provided should additional questions or concerns arise. Thank you for the referral and allowing [place] to share in the care of your patient.   Carollynn Pennywell P. Florene Glen, [place], [facility] Licensed, Insurance risk surveyor [email or web address] phone: [phone]  The patient was seen for a total of 31 minutes in face-to-face genetic counseling.  This patient was discussed with Drs. Magrinat, Lindi Adie and/or Burr Medico who agrees with the above.    _______________________________________________________________________ For Office Staff:  Number of people involved in session: 1 Was an Intern/ student involved with case: no

## 2019-10-08 ENCOUNTER — Telehealth: Payer: Self-pay | Admitting: Genetic Counselor

## 2019-10-08 ENCOUNTER — Encounter: Payer: Self-pay | Admitting: Genetic Counselor

## 2019-10-08 ENCOUNTER — Ambulatory Visit: Payer: Self-pay | Admitting: Genetic Counselor

## 2019-10-08 ENCOUNTER — Telehealth: Payer: Self-pay | Admitting: Obstetrics and Gynecology

## 2019-10-08 DIAGNOSIS — Z1509 Genetic susceptibility to other malignant neoplasm: Secondary | ICD-10-CM | POA: Insufficient documentation

## 2019-10-08 DIAGNOSIS — Z1379 Encounter for other screening for genetic and chromosomal anomalies: Secondary | ICD-10-CM | POA: Insufficient documentation

## 2019-10-08 DIAGNOSIS — Z1501 Genetic susceptibility to malignant neoplasm of breast: Secondary | ICD-10-CM

## 2019-10-08 NOTE — Progress Notes (Signed)
GENETIC TEST RESULTS   Patient Name: Jasmin Duran Patient Age: 48 y.o. Encounter Date: 10/08/2019  Referring Provider: Sumner Boast, MD    Ms. Virgil's genetic testing was called to her today.  She was seen in the [facility] clinic on September 16, 2019 due to a family history of cancer, a known family mutation in [place], and concern regarding a hereditary predisposition to cancer in the family. Please refer to the prior Genetics clinic note for more information regarding Ms. Mccullar's medical and family histories and our assessment at the time.   FAMILY HISTORY:  We obtained a detailed, 4-generation family history.  Significant diagnoses are listed below: Family History  Problem Relation Age of Onset  . Breast cancer Maternal Grandmother        dx younger than 40  . Cancer Maternal Grandfather        lung cancer  . Asthma Father   . Asthma Paternal Grandfather   . Asthma Maternal Aunt   . Ovarian cancer Mother        stage 34  . Cancer Mother        stomach wall  . BRCA 1/2 Mother        BRCA2 +  . Cerebral palsy Sister   . COPD Paternal 86   . Asthma Paternal Aunt   . Breast cancer Other        d. 27  . Asthma Paternal Aunt     The patient does not have children.  She has one sister who is cancer free.  Her parents are both living.  The patient's mother has a history of melanoma and was diagnosed with ovarian cancer at 46.  She is an only child.  The maternal grandmother was diagnosed with breast cancer under age 60.  The grandfather had lung cancer and also died under 80. He had a sister who had breast cancer.  The patient's father has asthma. He has two sisters who are cancer free, both have asthma, and one died of COPD.  The paternal grandparents are deceased from non-cancer related issues.  Ms. Kolodziej is unaware of previous family history of genetic testing for hereditary cancer risks. Patient's maternal ancestors are of [place], [place], and [place]  descent, and paternal ancestors are of [place], [place] and Native American descent. There is no reported Ashkenazi Jewish ancestry. There is no known consanguinity.    GENETIC TESTING:  At the time of Ms. Fretwell's visit, we recommended she pursue genetic testing of the CancerNext+RNAinsight test. The genetic testing reported on October 07, 2019 through the CancerNext+RNAinsight Cancer Panel offered by [facility] identified a single, heterozygous pathogenic gene mutation called BRCA2, [id number]. There were no deleterious mutations in  APC, ATM, BARD1, BMPR1A, BRCA1, BRIP1, CDH1, CDK4, CDKN2A, CHEK2, DICER1, HOXB13, EPCAM, GREM1, MLH1, MRE11A, MSH2, MSH6, MUTYH, NBN, NF1, PALB2, PMS2, POLD1, POLE, PTEN, RAD50, RAD51C, RAD51D, SMAD4, SMARCA4, STK11, and TP53.  Marland Kitchen   MEDICAL MANAGEMENT: Women who have a BRCA mutation have an increased risk for both breast and ovarian cancer.   As discussed with Ms. Tamera Punt, to reduce the risk for breast cancer, prophylactic bilateral mastectomy is the most effective option for risk reduction. However, for women who choose to keep their breasts intensified screening is equally safe.  We recommend yearly mammograms, yearly breast MRI, twice-yearly clinical breast exams, and monthly self-breast exams. Ms. Homer will be seen in our high risk clinic to discuss high risk screening and the option of discussing things further regarding her  surgical options.  To reduce the risk for ovarian cancer, we recommend Ms. Charley have a prophylactic bilateral salpingo-oophorectomy when childbearing is completed, if planned. We discussed that screening with CA-125 blood tests and transvaginal ultrasounds can be done twice per year. However, these tests have not been shown to detect ovarian cancer at an early stage.  Ms. Scotti will follow up with Dr. Talbert Nan in regards to her ovarian cancer risk.    RISK REDUCTION: There are several things that can be offered to  individuals who are carriers for BRCA mutations that will reduce the risk for getting cancer.    The use of oral contraceptives can lower the risk for ovarian cancer, and, per case control studies, does not significantly increase the risk for breast cancer in BRCA patients.  Case control studies have shown that oral contraceptives can lower the risk for ovarian cancer in women with BRCA mutations. Additionally, a more recent meta-analysis, including one cohort (n=3,181) and one case control study (1,096 cases and 2,878 controls) also showed an inverse correlation between ovarian cancer and ever having used oral contraceptives (OR, 0.58; 95% CI = 0.46-0.73).  Studies on oral contraceptives and breast cancer have been conflicting, with some studies suggesting that there is not an increased risk for breast cancer in BRCA mutation carriers, while others suggest that there could be a risk.  That said, two meta-analysis studies have shown that there is not an increased risk for breast cancer with oral contraceptive use in BRCA1 and BRCA2 carriers.    In individuals who have a prophylactic bilateral salpingo-oophorectomy (BSO), the risk for breast cancer is reduced by up to 50%.  It has been reported that short term hormone replacement therapy in women undergoing prophylactic BSO does not negate the reduction of breast cancer risk associated with surgery (1.2018 NCCN guidelines).  FAMILY MEMBERS: It is important that all of Ms. Wimer's relatives (both men and women) know of the presence of this gene mutation. Site-specific genetic testing can sort out who in the family is at risk and who is not.   Ms. Greenly sister has a 50% chance to have inherited this mutation. Her sister has cerebral palsy and is both physically and cognitively affected.  She is unclear on whether they will put her sister through testing, as they are not sure what they would do in regards to screening should she test positive.     SUPPORT AND RESOURCES: If Ms. Socorro is interested in BRCA-specific information and support, there are two groups, Facing Our Risk (www.facingourrisk.com) and Bright Pink (www.brightpink.org) which some people have found useful. They provide opportunities to speak with other individuals from high-risk families. To locate genetic counselors in other cities, visit the website of the [employer] (ArtistMovie.se) and Secretary/administrator for a Social worker by zip code.  We encouraged Ms. Cannaday to remain in contact with [place] on an annual basis so we can update her personal and family histories, and let her know of advances in cancer genetics that may benefit the family. Our contact number was provided. Ms. Randa questions were answered to her satisfaction today, and she knows she is welcome to call anytime with additional questions.    P. Florene Glen, [place], [facility] Licensed, Insurance risk surveyor [email or web address] phone: [phone]

## 2019-10-08 NOTE — Telephone Encounter (Signed)
Patient received results from genetic testing and she would like to talk to Dr Talbert Nan about surgery.

## 2019-10-08 NOTE — Telephone Encounter (Signed)
Revealed that patient has a BRCA2 pathogenic variant.  No other genetic mutations were identified.  Patient declined coming back for genetic counseling.  She feels that she has spent a lot of time processing her risk, and working through her mother's cancer diagnosis.  We will refer her back to Dr. Talbert Nan and send her to the high risk clinic.

## 2019-10-09 ENCOUNTER — Encounter: Payer: Self-pay | Admitting: Genetic Counselor

## 2019-10-09 ENCOUNTER — Telehealth: Payer: Self-pay | Admitting: Hematology and Oncology

## 2019-10-09 NOTE — Telephone Encounter (Signed)
Ms. Jasmin Duran has been cld and scheduled to be seen in the high risk breast clinic for BRCA2 gene. She's been scheduled to see Dr. Gudena on 11/10 at 1pm. Ms. Jasmin Duran is aware to arrive 15 minutes early. °

## 2019-10-09 NOTE — Telephone Encounter (Signed)
Call to patient. Patient states she received her genetic testing results and is BRCA 2+. Patient states she is ready to discuss surgery with Dr. Talbert Nan since results were positive.. OV scheduled for Tuesday 10-13-2019 at 0900. Patient agreeable to date and time of appointment.   Routing to provider and will close encounter.

## 2019-10-12 NOTE — Progress Notes (Signed)
GYNECOLOGY  VISIT   HPI: 48 y.o.   Married White or Caucasian Not Hispanic or Latino  female   G0P0 with Patient's last menstrual period was 09/24/2019 (exact date).   here to discuss genetic testing, results, and surgery. The patient was just diagnosed with a BRCA 2 gene mutation. She had a recent normal GYN ultrasound and CA 125.   She has a h/o an ASCUS, +HPV pap. 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 F/u paps since then have been normal.   Last pap in 9/20 negative, negative HPV  GYNECOLOGIC HISTORY: Patient's last menstrual period was 09/24/2019 (exact date). Contraception:Nuvaring  Menopausal hormone therapy: None        OB History    Gravida  0   Para      Term      Preterm      AB      Living        SAB      TAB      Ectopic      Multiple      Live Births                 Patient Active Problem List   Diagnosis Date Noted  . Genetic testing 10/08/2019  . BRCA2 gene mutation positive 10/08/2019  . Family history of breast cancer   . Family history of ovarian cancer   . Family history of BRCA2 gene positive   . 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 Restless leg syndrome 02/13/2018  . Chronic use of benzodiazepine for therapeutic purpose 02/13/2018  . Elevated blood pressure reading 02/13/2018  . Primary insomnia 07/10/2013    Past Medical History:  Diagnosis Date  . Abnormal Pap smear of cervix    8/16 ASCUS HPV HR +  . Anxiety   . Cold induced bronchospasm   . Family history of BRCA2 gene positive   . Family history of breast cancer   . Family history of ovarian cancer   . Insomnia     Past Surgical History:  Procedure Laterality Date  . WISDOM TOOTH EXTRACTION      Current Outpatient Medications  Medication Sig Dispense Refill  . albuterol (PROVENTIL HFA;VENTOLIN HFA) 108 (90 Base) MCG/ACT inhaler Inhale 2 puffs, 15 minutes before physical activity 1 Inhaler 2  . clonazePAM (KLONOPIN) 0.5 MG tablet     . etonogestrel-ethinyl  estradiol (NUVARING) 0.12-0.015 MG/24HR vaginal ring INSERT 1 RING VAGINALLY AND LEAVE IN PLACE FOR 4 CONSECUTIVE WEEKS, THEN REMOVE FOR 3 DAYS 1 each 12   No current facility-administered medications for this visit.      ALLERGIES: Patient has no known allergies.  Family History  Problem Relation Age of Onset  . Breast cancer Maternal Grandmother        dx younger than 69  . Cancer Maternal Grandfather        lung cancer  . Asthma Father   . Asthma Paternal Grandfather   . Asthma Maternal Aunt   . Ovarian cancer Mother        stage 11  . Cancer Mother        stomach wall  . BRCA 1/2 Mother        BRCA2 +  . Cerebral palsy Sister   . COPD Paternal 52   . Asthma Paternal Aunt   . Breast cancer Other        d. 55  . Asthma Paternal Aunt     Social History   Socioeconomic  History  . Marital status: Married    Spouse name: Not on file  . Number of children: Not on file  . Years of education: Not on file  . Highest education level: Not on file  Occupational History  . Not on file  Social Needs  . Financial resource strain: Not on file  . Food insecurity    Worry: Not on file    Inability: Not on file  . Transportation needs    Medical: Not on file    Non-medical: Not on file  Tobacco Use  . Smoking status: Never Smoker  . Smokeless tobacco: Never Used  Substance and Sexual Activity  . Alcohol use: No  . Drug use: No  . Sexual activity: Yes    Partners: Male    Birth control/protection: Inserts    Comment: nuvaring  Lifestyle  . Physical activity    Days per week: Not on file    Minutes per session: Not on file  . Stress: Not on file  Relationships  . Social Herbalist on phone: Not on file    Gets together: Not on file    Attends religious service: Not on file    Active member of club or organization: Not on file    Attends meetings of clubs or organizations: Not on file    Relationship status: Not on file  . Intimate partner violence     Fear of current or ex partner: Not on file    Emotionally abused: Not on file    Physically abused: Not on file    Forced sexual activity: Not on file  Other Topics Concern  . Not on file  Social History Narrative  . Not on file  Works in Horticulturist, commercial.   Review of Systems  Constitutional: Negative.   HENT: Negative.   Eyes: Negative.   Respiratory: Negative.   Cardiovascular: Negative.   Gastrointestinal: Negative.   Genitourinary: Negative.   Musculoskeletal: Negative.   Skin: Negative.   Neurological: Negative.   Endo/Heme/Allergies: Negative.   Psychiatric/Behavioral: Negative.     PHYSICAL EXAMINATION:    BP 140/86 (BP Location: Right Arm, Patient Position: Sitting, Cuff Size: Normal)   Pulse 76   Temp (!) 97.2 F (36.2 C) (Skin)   Wt 169 lb 9.6 oz (76.9 kg)   LMP 09/24/2019 (Exact Date)   BMI 26.37 kg/m     General appearance: alert, cooperative and appears stated age Neck: supple, no thyromegaly Heart: regular rate and rhythm Lungs: CTAB Abdomen: soft, non-tender; bowel sounds normal; no masses,  no organomegaly Extremities: normal, atraumatic, no cyanosis Skin: normal color, texture and turgor, no rashes or lesions Lymph: normal cervical supraclavicular and inguinal nodes Neurologic: grossly normal   ASSESSMENT BRCA 2+. Discussed need for BSO, discussed option of TLH. She does have a h/o abnormal paps and HR HPV. hysterectomy eliminates the risk of cervical dysplasia. Discussed benefit of not needing progesterone with short term ERT and decreased risk with hysterectomy if she does need tamoxifen. Discussed risks of longer surgery and small risk of injury to surrounding organs with hysterectomy.    PLAN TLH/BSO Discussed total laparoscopic hysterectomy, BSO and cystoscopy. Reviewed the risks of the procedure, including infection, bleeding, damage to bowel/badder/vessels/ureters.  Discussed the possible need for laparotomy. Discussed post operative  recovery and risk of cuff dehiscence. All of her questions were answered Discussed short term ERT, will confer with Oncologist    An After Visit Summary was printed and  given to the patient.  Over 20 minutes face to face time of which over 50% was spent in counseling.   CC: Dr Lindi Adie, Dr Derrek Monaco

## 2019-10-13 ENCOUNTER — Telehealth: Payer: Self-pay | Admitting: Obstetrics and Gynecology

## 2019-10-13 ENCOUNTER — Other Ambulatory Visit: Payer: Self-pay

## 2019-10-13 ENCOUNTER — Ambulatory Visit (INDEPENDENT_AMBULATORY_CARE_PROVIDER_SITE_OTHER): Payer: Managed Care, Other (non HMO) | Admitting: Obstetrics and Gynecology

## 2019-10-13 ENCOUNTER — Encounter: Payer: Self-pay | Admitting: Obstetrics and Gynecology

## 2019-10-13 VITALS — BP 140/86 | HR 76 | Temp 97.2°F | Wt 169.6 lb

## 2019-10-13 DIAGNOSIS — Z1501 Genetic susceptibility to malignant neoplasm of breast: Secondary | ICD-10-CM | POA: Diagnosis not present

## 2019-10-13 DIAGNOSIS — Z1509 Genetic susceptibility to other malignant neoplasm: Secondary | ICD-10-CM

## 2019-10-13 NOTE — Telephone Encounter (Signed)
Call placed to patient to review benefit for surgery. Patient acknowledges understanding of information presented. Patient is aware this is the professional benefit only. Patient understands, once surgery is scheduled, the hospital will contact her with their benefits. Patient has confirmed and is ready to proceed with scheduling. Forwarding to Conservation officer, historic buildings for scheduling.  Routing to Lamont Snowball, RN

## 2019-10-13 NOTE — Telephone Encounter (Signed)
Call to patient. Reviewed surgery date options. Patient desires to proceed with October 27, 2019. Agreeable to Covid testing requirments.  Will schedule and call her once confirmed.

## 2019-10-13 NOTE — Patient Instructions (Signed)

## 2019-10-14 ENCOUNTER — Encounter: Payer: Self-pay | Admitting: Obstetrics and Gynecology

## 2019-10-14 NOTE — Telephone Encounter (Signed)
Patient returned call

## 2019-10-14 NOTE — Telephone Encounter (Signed)
Returned call to patient. Surgery information form reviewed in detail with patient and she verbalized understanding. Patient scheduled for pre op at [facility] on 10-23-2019. Advised will have surgical covid prescreening test on 10-23-2019 at 1450. Advised will need to quarantine after testing until surgery. Patient agreeable. "8 things to do during your quarantine" reviewed with patient and she verbalized understanding. Post op appointments scheduled for 11-03-2019 at 1515 and 11-24-2019 at 1315. Patient agreeable to date and time of all appointments. Advised patient will mail a copy of information forms to her. Address on file confirmed.   Routing to provider and will close encounter.   Cc Lamont Snowball, RN

## 2019-10-14 NOTE — H&P (Signed)
Salvadore Dom, MD  Physician  Specialty:  Obstetrics and Gynecology  Progress Notes    Signed  Encounter Date:  10/13/2019      Related encounter: Office Visit from 10/13/2019 in [place]         Show:Clear all '[x]' Manual'[x]' Template'[]' Copied  Added by: '[x]' Salvadore Dom, MD'[x]' Sprague, Harley Hallmark, RN  '[]' Hover for details GYNECOLOGY  VISIT   HPI: 48 y.o.   Married White or Caucasian Not Hispanic or Latino  female   G0P0 with Patient's last menstrual period was 09/24/2019 (exact date).   here to discuss genetic testing, results, and surgery. The patient was just diagnosed with a BRCA 2 gene mutation. She had a recent normal GYN ultrasound and CA 125.   She has a h/o an ASCUS, +HPV pap. 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 F/u paps since then have been normal.   Last pap in 9/20 negative, negative HPV  GYNECOLOGIC HISTORY: Patient's last menstrual period was 09/24/2019 (exact date). Contraception:Nuvaring           Menopausal hormone therapy: None                OB History    Gravida  0   Para      Term      Preterm      AB      Living        SAB      TAB      Ectopic      Multiple      Live Births                     Patient Active Problem List   Diagnosis Date Noted  . Genetic testing 10/08/2019  . BRCA2 gene mutation positive 10/08/2019  . Family history of breast cancer   . Family history of ovarian cancer   . Family history of BRCA2 gene positive   . 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 Restless leg syndrome 02/13/2018  . Chronic use of benzodiazepine for therapeutic purpose 02/13/2018  . Elevated blood pressure reading 02/13/2018  . Primary insomnia 07/10/2013        Past Medical History:  Diagnosis Date  . Abnormal Pap smear of cervix    8/16 ASCUS HPV HR +  . Anxiety   . Cold induced bronchospasm   . Family history of BRCA2 gene positive   . Family history  of breast cancer   . Family history of ovarian cancer   . Insomnia          Past Surgical History:  Procedure Laterality Date  . WISDOM TOOTH EXTRACTION            Current Outpatient Medications  Medication Sig Dispense Refill  . albuterol (PROVENTIL HFA;VENTOLIN HFA) 108 (90 Base) MCG/ACT inhaler Inhale 2 puffs, 15 minutes before physical activity 1 Inhaler 2  . clonazePAM (KLONOPIN) 0.5 MG tablet     . etonogestrel-ethinyl estradiol (NUVARING) 0.12-0.015 MG/24HR vaginal ring INSERT 1 RING VAGINALLY AND LEAVE IN PLACE FOR 4 CONSECUTIVE WEEKS, THEN REMOVE FOR 3 DAYS 1 each 12   No current facility-administered medications for this visit.      ALLERGIES: Patient has no known allergies.       Family History  Problem Relation Age of Onset  . Breast cancer Maternal Grandmother        dx younger than 74  . Cancer Maternal Grandfather  lung cancer  . Asthma Father   . Asthma Paternal Grandfather   . Asthma Maternal Aunt   . Ovarian cancer Mother        stage 68  . Cancer Mother        stomach wall  . BRCA 1/2 Mother        BRCA2 +  . Cerebral palsy Sister   . COPD Paternal 52   . Asthma Paternal Aunt   . Breast cancer Other        d. 16  . Asthma Paternal Aunt     Social History        Socioeconomic History  . Marital status: Married    Spouse name: Not on file  . Number of children: Not on file  . Years of education: Not on file  . Highest education level: Not on file  Occupational History  . Not on file  Social Needs  . Financial resource strain: Not on file  . Food insecurity    Worry: Not on file    Inability: Not on file  . Transportation needs    Medical: Not on file    Non-medical: Not on file  Tobacco Use  . Smoking status: Never Smoker  . Smokeless tobacco: Never Used  Substance and Sexual Activity  . Alcohol use: No  . Drug use: No  . Sexual activity: Yes    Partners: Male    Birth  control/protection: Inserts    Comment: nuvaring  Lifestyle  . Physical activity    Days per week: Not on file    Minutes per session: Not on file  . Stress: Not on file  Relationships  . Social Herbalist on phone: Not on file    Gets together: Not on file    Attends religious service: Not on file    Active member of club or organization: Not on file    Attends meetings of clubs or organizations: Not on file    Relationship status: Not on file  . Intimate partner violence    Fear of current or ex partner: Not on file    Emotionally abused: Not on file    Physically abused: Not on file    Forced sexual activity: Not on file  Other Topics Concern  . Not on file  Social History Narrative  . Not on file  Works in Horticulturist, commercial.   Review of Systems  Constitutional: Negative.   HENT: Negative.   Eyes: Negative.   Respiratory: Negative.   Cardiovascular: Negative.   Gastrointestinal: Negative.   Genitourinary: Negative.   Musculoskeletal: Negative.   Skin: Negative.   Neurological: Negative.   Endo/Heme/Allergies: Negative.   Psychiatric/Behavioral: Negative.     PHYSICAL EXAMINATION:    BP 140/86 (BP Location: Right Arm, Patient Position: Sitting, Cuff Size: Normal)   Pulse 76   Temp (!) 97.2 F (36.2 C) (Skin)   Wt 169 lb 9.6 oz (76.9 kg)   LMP 09/24/2019 (Exact Date)   BMI 26.37 kg/m     General appearance: alert, cooperative and appears stated age Neck: supple, no thyromegaly Heart: regular rate and rhythm Lungs: CTAB Abdomen: soft, non-tender; bowel sounds normal; no masses,  no organomegaly Extremities: normal, atraumatic, no cyanosis Skin: normal color, texture and turgor, no rashes or lesions Lymph: normal cervical supraclavicular and inguinal nodes Neurologic: grossly normal   ASSESSMENT BRCA 2+. Discussed need for BSO, discussed option of TLH. She does have a h/o abnormal  paps and HR HPV. hysterectomy  eliminates the risk of cervical dysplasia. Discussed benefit of not needing progesterone with short term ERT and decreased risk with hysterectomy if she does need tamoxifen. Discussed risks of longer surgery and small risk of injury to surrounding organs with hysterectomy.    PLAN TLH/BSO Discussed total laparoscopic hysterectomy, BSO and cystoscopy. Reviewed the risks of the procedure, including infection, bleeding, damage to bowel/badder/vessels/ureters.  Discussed the possible need for laparotomy. Discussed post operative recovery and risk of cuff dehiscence. All of her questions were answered Discussed short term ERT, will confer with Oncologist    An After Visit Summary was printed and given to the patient.  Over 20 minutes face to face time of which over 50% was spent in counseling.   CC: Dr Lindi Adie, Dr Derrek Monaco

## 2019-10-14 NOTE — Telephone Encounter (Signed)
Message left to return call to Koben Daman at [phone].    

## 2019-10-22 ENCOUNTER — Encounter (HOSPITAL_COMMUNITY): Payer: Self-pay

## 2019-10-22 NOTE — Patient Instructions (Signed)
DUE TO COVID-19 ONLY ONE VISITOR IS ALLOWED IN WAITING ROOM (VISITOR WILL HAVE A TEMPERATURE CHECK ON ARRIVAL AND MUST WEAR A FACE MASK THE ENTIRE TIME.)  ONCE YOU ARE ADMITTED TO YOUR PRIVATE ROOM, THE SAME ONE VISITOR IS ALLOWED TO VISIT DURING VISITING HOURS ONLY.  Your COVID swab testing is scheduled for Today, Immediately after pre op appointment. You must self quarantine after your testing per handout given to you at the testing site.  ([address], [facility] Drive up testing enter pre-surgical testing line)  Your procedure is scheduled on:  Tuesday, Nov. 3, 2020  [facility] AT  5:30 A. M.   Call this number if you have problems the morning of surgery:  [phone].   OUR ADDRESS IS [address].  WE ARE LOCATED IN [place]                                   [place].                                     REMEMBER:  DO NOT EAT FOOD AFTER MIDNIGHT .    MAY HAVE LIQUIDS UNTIL 4:30 AM DAY OF SURGERY  CLEAR LIQUID DIET  Foods Allowed                                                                     Foods Excluded  Water, Black Coffee and tea, regular and decaf                             liquids that you cannot  Plain Jell-O in any flavor  (No red)                                           see through such as: Fruit ices (not with fruit pulp)                                     milk, soups, orange juice  Iced Popsicles (No red)                                    All solid food Carbonated beverages, regular and diet                                    Apple juices Sports drinks like Gatorade (No red) Lightly seasoned clear broth or consume(fat free) Sugar, honey syrup  Sample Menu Breakfast                                Lunch  Supper Cranberry juice                    Beef broth                            Chicken broth Jell-O                                     Grape  juice                           Apple juice Coffee or tea                        Jell-O                                      Popsicle                                                Coffee or tea                        Coffee or tea  BRUSH YOUR TEETH THE MORNING OF SURGERY.  TAKE THESE MEDICATIONS MORNING OF SURGERY WITH A SIP OF WATER:  NONE  BRING ASTHMA INHALER DAY OF SURGERY  DO NOT WEAR JEWERLY, MAKE UP, OR NAIL POLISH.  DO NOT WEAR LOTIONS, POWDERS, PERFUMES/COLOGNE OR DEODORANT.  DO NOT SHAVE FOR 24 HOURS PRIOR TO DAY OF SURGERY.  CONTACTS, GLASSES, OR DENTURES MAY NOT BE WORN TO SURGERY.                                    [facility] IS NOT RESPONSIBLE  FOR ANY BELONGINGS.          BRING ALL PRESCRIPTION MEDICATIONS WITH YOU THE DAY OF SURGERY IN ORIGINAL CONTAINERS                                                               [place] [facility] - Preparing for Surgery Before surgery, you can play an important role.  Because skin is not sterile, your skin needs to be as free of germs as possible.  You can reduce the number of germs on your skin by washing with CHG (chlorahexidine gluconate) soap before surgery.  CHG is an antiseptic cleaner which kills germs and bonds with the skin to continue killing germs even after washing. Please DO NOT use if you have an allergy to CHG or antibacterial soaps.  If your skin becomes reddened/irritated stop using the CHG and inform your nurse when you arrive at Short Stay. Do not shave (including legs and underarms) for at least 48 hours prior to the first CHG shower.  You may shave your face/neck.  Please follow these instructions carefully:  1.  Shower with CHG Soap the night before surgery and the  morning of surgery.  2.  If you choose to wash your hair, wash your hair first as usual with your normal  shampoo.  3.  After you shampoo, rinse your hair and body thoroughly to remove the shampoo.                             4.  Use CHG as you would  any other liquid soap.  You can apply chg directly to the skin and wash.  Gently with a scrungie or clean washcloth.  5.  Apply the CHG Soap to your body ONLY FROM THE NECK DOWN.   Do   not use on face/ open                           Wound or open sores. Avoid contact with eyes, ears mouth and   genitals (private parts).                       Wash face,  Genitals (private parts) with your normal soap.             6.  Wash thoroughly, paying special attention to the area where your    surgery  will be performed.  7.  Thoroughly rinse your body with warm water from the neck down.  8.  DO NOT shower/wash with your normal soap after using and rinsing off the CHG Soap.                9.  Pat yourself dry with a clean towel.            10.  Wear clean pajamas.            11.  Place clean sheets on your bed the night of your first shower and do not  sleep with pets. Day of Surgery : Do not apply any lotions/deodorants the morning of surgery.  Please wear clean clothes to the hospital/surgery center.  FAILURE TO FOLLOW THESE INSTRUCTIONS MAY RESULT IN THE CANCELLATION OF YOUR SURGERY  PATIENT SIGNATURE_________________________________  NURSE SIGNATURE__________________________________  ________________________________________________________________________

## 2019-10-23 ENCOUNTER — Encounter (HOSPITAL_COMMUNITY): Payer: Self-pay

## 2019-10-23 ENCOUNTER — Other Ambulatory Visit (HOSPITAL_COMMUNITY)
Admission: RE | Admit: 2019-10-23 | Discharge: 2019-10-23 | Disposition: A | Discharge status: Home / Self Care | Payer: Managed Care, Other (non HMO) | Source: Ambulatory Visit | Attending: Obstetrics and Gynecology | Admitting: Obstetrics and Gynecology

## 2019-10-23 ENCOUNTER — Encounter (HOSPITAL_COMMUNITY)
Admission: RE | Admit: 2019-10-23 | Discharge: 2019-10-23 | Disposition: A | Discharge status: Home / Self Care | Payer: Managed Care, Other (non HMO) | Source: Ambulatory Visit | Attending: Obstetrics and Gynecology | Admitting: Obstetrics and Gynecology

## 2019-10-23 ENCOUNTER — Other Ambulatory Visit: Payer: Self-pay

## 2019-10-23 DIAGNOSIS — Z01812 Encounter for preprocedural laboratory examination: Secondary | ICD-10-CM | POA: Diagnosis present

## 2019-10-23 DIAGNOSIS — Z20828 Contact with and (suspected) exposure to other viral communicable diseases: Secondary | ICD-10-CM | POA: Diagnosis not present

## 2019-10-23 HISTORY — DX: Unspecified asthma, uncomplicated: J45.909

## 2019-10-23 HISTORY — DX: Restless legs syndrome: G25.81

## 2019-10-23 LAB — CBC WITH DIFFERENTIAL/PLATELET
Abs Immature Granulocytes: 0.01 10*3/uL (ref 0.00–0.07)
Basophils Absolute: 0.1 10*3/uL (ref 0.0–0.1)
Basophils Relative: 1 %
Eosinophils Absolute: 0.1 10*3/uL (ref 0.0–0.5)
Eosinophils Relative: 2 %
HCT: 39.5 % (ref 36.0–46.0)
Hemoglobin: 13.6 g/dL (ref 12.0–15.0)
Immature Granulocytes: 0 %
Lymphocytes Relative: 31 %
Lymphs Abs: 2.2 10*3/uL (ref 0.7–4.0)
MCH: 31.7 pg (ref 26.0–34.0)
MCHC: 34.4 g/dL (ref 30.0–36.0)
MCV: 92.1 fL (ref 80.0–100.0)
Monocytes Absolute: 0.5 10*3/uL (ref 0.1–1.0)
Monocytes Relative: 7 %
Neutro Abs: 4.2 10*3/uL (ref 1.7–7.7)
Neutrophils Relative %: 59 %
Platelets: 311 10*3/uL (ref 150–400)
RBC: 4.29 MIL/uL (ref 3.87–5.11)
RDW: 12.4 % (ref 11.5–15.5)
WBC: 7.1 10*3/uL (ref 4.0–10.5)
nRBC: 0 % (ref 0.0–0.2)

## 2019-10-23 LAB — ABO/RH: ABO/RH(D): A POS

## 2019-10-23 LAB — COMPREHENSIVE METABOLIC PANEL
ALT: 13 U/L (ref 0–44)
AST: 15 U/L (ref 15–41)
Albumin: 4 g/dL (ref 3.5–5.0)
Alkaline Phosphatase: 63 U/L (ref 38–126)
Anion gap: 9 (ref 5–15)
BUN: 12 mg/dL (ref 6–20)
CO2: 23 mmol/L (ref 22–32)
Calcium: 9 mg/dL (ref 8.9–10.3)
Chloride: 108 mmol/L (ref 98–111)
Creatinine, Ser: 0.83 mg/dL (ref 0.44–1.00)
GFR calc Af Amer: >60 mL/min (ref >60)
GFR calc non Af Amer: >60 mL/min (ref >60)
Glucose, Bld: 90 mg/dL (ref 70–99)
Potassium: 3.8 mmol/L (ref 3.5–5.1)
Sodium: 140 mmol/L (ref 135–145)
Total Bilirubin: 0.4 mg/dL (ref 0.3–1.2)
Total Protein: 7.9 g/dL (ref 6.5–8.1)

## 2019-10-23 NOTE — Progress Notes (Signed)
SPOKE W/  Jennifier     SCREENING SYMPTOMS OF COVID 19:   COUGH--NO  RUNNY NOSE--- NO  SORE THROAT---NO  NASAL CONGESTION----NO  SNEEZING----NO  SHORTNESS OF BREATH---NO  DIFFICULTY BREATHING---NO  TEMP >100.0 -----NO  UNEXPLAINED BODY ACHES------NO  CHILLS -------- NO  HEADACHES ---------NO  LOSS OF SMELL/ TASTE --------NO    HAVE YOU OR ANY FAMILY MEMBER TRAVELLED PAST 14 DAYS OUT OF THE   COUNTY---Lives in [facility] [place]----NO COUNTRY----NO  HAVE YOU OR ANY FAMILY MEMBER BEEN EXPOSED TO ANYONE WITH COVID 19? NO

## 2019-10-23 NOTE — Progress Notes (Signed)
PCP - Dr. Wilhelmenia Blase, [facility] Cardiologist - N/A  Chest x-ray - N/A EKG - N/A Stress Test - N/A ECHO - N/A Cardiac Cath - N/A  Sleep Study - 2008 CPAP - No longer uses CPAP  Fasting Blood Sugar - N/A Checks Blood Sugar __N/A___ times a day  Blood Thinner Instructions:  N/A Aspirin Instructions:  N/A Last Dose:  N/A  Anesthesia review: N/A  Patient denies shortness of breath, fever, cough and chest pain at PAT appointment   Patient verbalized understanding of instructions that were given to them at the PAT appointment. Patient was also instructed that they will need to review over the PAT instructions again at home before surgery.

## 2019-10-24 LAB — NOVEL CORONAVIRUS, NAA (HOSP ORDER, SEND-OUT TO REF LAB; TAT 18-24 HRS): SARS-CoV-2, NAA: NOT DETECTED

## 2019-10-27 ENCOUNTER — Encounter (HOSPITAL_BASED_OUTPATIENT_CLINIC_OR_DEPARTMENT_OTHER)
Admission: RE | Disposition: A | Discharge status: Home / Self Care | Payer: Self-pay | Source: Home / Self Care | Attending: Obstetrics and Gynecology

## 2019-10-27 ENCOUNTER — Ambulatory Visit (HOSPITAL_BASED_OUTPATIENT_CLINIC_OR_DEPARTMENT_OTHER): Payer: Managed Care, Other (non HMO) | Admitting: Physician Assistant

## 2019-10-27 ENCOUNTER — Ambulatory Visit (HOSPITAL_BASED_OUTPATIENT_CLINIC_OR_DEPARTMENT_OTHER): Payer: Managed Care, Other (non HMO) | Admitting: Anesthesiology

## 2019-10-27 ENCOUNTER — Ambulatory Visit (HOSPITAL_BASED_OUTPATIENT_CLINIC_OR_DEPARTMENT_OTHER)
Admission: RE | Admit: 2019-10-27 | Discharge: 2019-10-27 | Disposition: A | Discharge status: Home / Self Care | Payer: Managed Care, Other (non HMO) | Attending: Obstetrics and Gynecology | Admitting: Obstetrics and Gynecology

## 2019-10-27 ENCOUNTER — Other Ambulatory Visit: Payer: Self-pay

## 2019-10-27 ENCOUNTER — Encounter (HOSPITAL_BASED_OUTPATIENT_CLINIC_OR_DEPARTMENT_OTHER): Payer: Self-pay | Admitting: *Deleted

## 2019-10-27 DIAGNOSIS — N3289 Other specified disorders of bladder: Secondary | ICD-10-CM | POA: Diagnosis not present

## 2019-10-27 DIAGNOSIS — Z4002 Encounter for prophylactic removal of ovary: Secondary | ICD-10-CM | POA: Insufficient documentation

## 2019-10-27 DIAGNOSIS — N840 Polyp of corpus uteri: Secondary | ICD-10-CM | POA: Insufficient documentation

## 2019-10-27 DIAGNOSIS — J45909 Unspecified asthma, uncomplicated: Secondary | ICD-10-CM | POA: Insufficient documentation

## 2019-10-27 DIAGNOSIS — Z79899 Other long term (current) drug therapy: Secondary | ICD-10-CM | POA: Diagnosis not present

## 2019-10-27 DIAGNOSIS — Z803 Family history of malignant neoplasm of breast: Secondary | ICD-10-CM | POA: Insufficient documentation

## 2019-10-27 DIAGNOSIS — Z4009 Encounter for prophylactic removal of other organ: Secondary | ICD-10-CM | POA: Insufficient documentation

## 2019-10-27 DIAGNOSIS — Z1502 Genetic susceptibility to malignant neoplasm of ovary: Secondary | ICD-10-CM | POA: Diagnosis not present

## 2019-10-27 DIAGNOSIS — G2581 Restless legs syndrome: Secondary | ICD-10-CM | POA: Diagnosis not present

## 2019-10-27 DIAGNOSIS — Z90710 Acquired absence of both cervix and uterus: Secondary | ICD-10-CM | POA: Diagnosis present

## 2019-10-27 DIAGNOSIS — N800 Endometriosis of uterus: Secondary | ICD-10-CM | POA: Insufficient documentation

## 2019-10-27 DIAGNOSIS — N736 Female pelvic peritoneal adhesions (postinfective): Secondary | ICD-10-CM | POA: Diagnosis not present

## 2019-10-27 DIAGNOSIS — Z8041 Family history of malignant neoplasm of ovary: Secondary | ICD-10-CM | POA: Diagnosis not present

## 2019-10-27 DIAGNOSIS — F419 Anxiety disorder, unspecified: Secondary | ICD-10-CM | POA: Insufficient documentation

## 2019-10-27 DIAGNOSIS — Z1501 Genetic susceptibility to malignant neoplasm of breast: Secondary | ICD-10-CM | POA: Diagnosis not present

## 2019-10-27 HISTORY — PX: CYSTOSCOPY: SHX5120

## 2019-10-27 HISTORY — PX: TOTAL LAPAROSCOPIC HYSTERECTOMY WITH BILATERAL SALPINGO OOPHORECTOMY: SHX6845

## 2019-10-27 LAB — TYPE AND SCREEN
ABO/RH(D): A POS
Antibody Screen: NEGATIVE

## 2019-10-27 LAB — CBC
HCT: 38.1 % (ref 36.0–46.0)
Hemoglobin: 12.9 g/dL (ref 12.0–15.0)
MCH: 31.7 pg (ref 26.0–34.0)
MCHC: 33.9 g/dL (ref 30.0–36.0)
MCV: 93.6 fL (ref 80.0–100.0)
Platelets: 282 10*3/uL (ref 150–400)
RBC: 4.07 MIL/uL (ref 3.87–5.11)
RDW: 12.2 % (ref 11.5–15.5)
WBC: 8.5 10*3/uL (ref 4.0–10.5)
nRBC: 0 % (ref 0.0–0.2)

## 2019-10-27 LAB — POCT PREGNANCY, URINE: Preg Test, Ur: NEGATIVE

## 2019-10-27 SURGERY — HYSTERECTOMY, TOTAL, LAPAROSCOPIC, WITH BILATERAL SALPINGO-OOPHORECTOMY
Anesthesia: General

## 2019-10-27 MED ORDER — BUPIVACAINE HCL (PF) 0.25 % IJ SOLN
INTRAMUSCULAR | Status: DC | PRN
  Administered 2019-10-27: 8 mL

## 2019-10-27 MED ORDER — CEFAZOLIN SODIUM-DEXTROSE 2-4 GM/100ML-% IV SOLN
INTRAVENOUS | Status: AC
  Filled 2019-10-27: qty 100

## 2019-10-27 MED ORDER — KETOROLAC TROMETHAMINE 30 MG/ML IJ SOLN
30.0000 mg | Freq: Four times a day (QID) | INTRAMUSCULAR | Status: DC
  Administered 2019-10-27: 30 mg via INTRAVENOUS
  Filled 2019-10-27: qty 1

## 2019-10-27 MED ORDER — ROCURONIUM BROMIDE 10 MG/ML (PF) SYRINGE
PREFILLED_SYRINGE | INTRAVENOUS | Status: AC
  Filled 2019-10-27: qty 10

## 2019-10-27 MED ORDER — OXYCODONE HCL 5 MG/5ML PO SOLN
5.0000 mg | Freq: Once | ORAL | Status: DC | PRN
  Filled 2019-10-27: qty 5

## 2019-10-27 MED ORDER — PROPOFOL 10 MG/ML IV BOLUS
INTRAVENOUS | Status: AC
  Filled 2019-10-27: qty 40

## 2019-10-27 MED ORDER — ONDANSETRON HCL 4 MG/2ML IJ SOLN
4.0000 mg | Freq: Four times a day (QID) | INTRAMUSCULAR | Status: DC | PRN
  Filled 2019-10-27: qty 2

## 2019-10-27 MED ORDER — ENOXAPARIN SODIUM 40 MG/0.4ML ~~LOC~~ SOLN
40.0000 mg | SUBCUTANEOUS | Status: DC
  Filled 2019-10-27: qty 0.4

## 2019-10-27 MED ORDER — DEXAMETHASONE SODIUM PHOSPHATE 4 MG/ML IJ SOLN
INTRAMUSCULAR | Status: DC | PRN
  Administered 2019-10-27: 10 mg via INTRAVENOUS

## 2019-10-27 MED ORDER — CLONAZEPAM 0.5 MG PO TABS
0.5000 mg | ORAL_TABLET | Freq: Every day | ORAL | Status: DC
  Filled 2019-10-27: qty 2

## 2019-10-27 MED ORDER — ENOXAPARIN SODIUM 40 MG/0.4ML ~~LOC~~ SOLN
40.0000 mg | SUBCUTANEOUS | Status: AC
  Administered 2019-10-27: 40 mg via SUBCUTANEOUS
  Filled 2019-10-27: qty 0.4

## 2019-10-27 MED ORDER — SODIUM CHLORIDE (PF) 0.9 % IJ SOLN
INTRAMUSCULAR | Status: DC | PRN
  Administered 2019-10-27: 30 mL

## 2019-10-27 MED ORDER — OXYCODONE HCL 5 MG PO TABS
5.0000 mg | ORAL_TABLET | ORAL | Status: DC | PRN
  Filled 2019-10-27: qty 2

## 2019-10-27 MED ORDER — ONDANSETRON HCL 4 MG/2ML IJ SOLN
INTRAMUSCULAR | Status: AC
  Filled 2019-10-27: qty 2

## 2019-10-27 MED ORDER — ALUM & MAG HYDROXIDE-SIMETH 200-200-20 MG/5ML PO SUSP
30.0000 mL | ORAL | Status: DC | PRN
  Filled 2019-10-27: qty 30

## 2019-10-27 MED ORDER — OXYCODONE HCL 5 MG PO TABS
5.0000 mg | ORAL_TABLET | Freq: Once | ORAL | Status: DC | PRN
  Filled 2019-10-27: qty 1

## 2019-10-27 MED ORDER — LIDOCAINE HCL (CARDIAC) PF 100 MG/5ML IV SOSY
PREFILLED_SYRINGE | INTRAVENOUS | Status: DC | PRN
  Administered 2019-10-27: 60 mg via INTRAVENOUS

## 2019-10-27 MED ORDER — MEPERIDINE HCL 25 MG/ML IJ SOLN
6.2500 mg | INTRAMUSCULAR | Status: DC | PRN
  Filled 2019-10-27: qty 1

## 2019-10-27 MED ORDER — LACTATED RINGERS IV SOLN
INTRAVENOUS | Status: DC
  Administered 2019-10-27: 06:00:00 via INTRAVENOUS
  Filled 2019-10-27: qty 1000

## 2019-10-27 MED ORDER — IBUPROFEN 800 MG PO TABS
800.0000 mg | ORAL_TABLET | Freq: Four times a day (QID) | ORAL | Status: DC
  Filled 2019-10-27: qty 1

## 2019-10-27 MED ORDER — MENTHOL 3 MG MT LOZG
1.0000 | LOZENGE | OROMUCOSAL | Status: DC | PRN
  Filled 2019-10-27: qty 9

## 2019-10-27 MED ORDER — ENOXAPARIN SODIUM 40 MG/0.4ML ~~LOC~~ SOLN
SUBCUTANEOUS | Status: AC
  Filled 2019-10-27: qty 0.4

## 2019-10-27 MED ORDER — LIDOCAINE 2% (20 MG/ML) 5 ML SYRINGE
INTRAMUSCULAR | Status: AC
  Filled 2019-10-27: qty 5

## 2019-10-27 MED ORDER — LACTATED RINGERS IV SOLN
INTRAVENOUS | Status: DC
  Administered 2019-10-27 (×3): via INTRAVENOUS
  Filled 2019-10-27: qty 1000

## 2019-10-27 MED ORDER — ESTRADIOL 0.1 MG/24HR TD PTTW: 1.0000 | MEDICATED_PATCH | TRANSDERMAL | 1 refills | Status: DC

## 2019-10-27 MED ORDER — MIDAZOLAM HCL 5 MG/5ML IJ SOLN
INTRAMUSCULAR | Status: DC | PRN
  Administered 2019-10-27: 2 mg via INTRAVENOUS

## 2019-10-27 MED ORDER — DOCUSATE SODIUM 100 MG PO CAPS
100.0000 mg | ORAL_CAPSULE | Freq: Two times a day (BID) | ORAL | Status: DC
  Administered 2019-10-27: 12:00:00 100 mg via ORAL
  Filled 2019-10-27: qty 1

## 2019-10-27 MED ORDER — ALBUTEROL SULFATE HFA 108 (90 BASE) MCG/ACT IN AERS
1.0000 | INHALATION_SPRAY | RESPIRATORY_TRACT | Status: DC | PRN
  Filled 2019-10-27: qty 6.7

## 2019-10-27 MED ORDER — FENTANYL CITRATE (PF) 100 MCG/2ML IJ SOLN
INTRAMUSCULAR | Status: DC | PRN
  Administered 2019-10-27 (×7): 50 ug via INTRAVENOUS

## 2019-10-27 MED ORDER — KETOROLAC TROMETHAMINE 30 MG/ML IJ SOLN
INTRAMUSCULAR | Status: DC | PRN
  Administered 2019-10-27: 30 mg via INTRAVENOUS

## 2019-10-27 MED ORDER — DOCUSATE SODIUM 100 MG PO CAPS
ORAL_CAPSULE | ORAL | Status: AC
  Filled 2019-10-27: qty 1

## 2019-10-27 MED ORDER — ACETAMINOPHEN 500 MG PO TABS
ORAL_TABLET | ORAL | Status: AC
  Filled 2019-10-27: qty 2

## 2019-10-27 MED ORDER — HYDROMORPHONE HCL 1 MG/ML IJ SOLN
0.2500 mg | INTRAMUSCULAR | Status: DC | PRN
  Filled 2019-10-27: qty 0.5

## 2019-10-27 MED ORDER — PROPOFOL 10 MG/ML IV BOLUS
INTRAVENOUS | Status: DC | PRN
  Administered 2019-10-27: 40 mg via INTRAVENOUS
  Administered 2019-10-27: 160 mg via INTRAVENOUS

## 2019-10-27 MED ORDER — CEFAZOLIN SODIUM-DEXTROSE 2-4 GM/100ML-% IV SOLN
2.0000 g | INTRAVENOUS | Status: AC
  Administered 2019-10-27: 07:00:00 2 g via INTRAVENOUS
  Filled 2019-10-27: qty 100

## 2019-10-27 MED ORDER — HYDROMORPHONE HCL 1 MG/ML IJ SOLN
0.2000 mg | INTRAMUSCULAR | Status: DC | PRN
  Filled 2019-10-27: qty 1

## 2019-10-27 MED ORDER — GABAPENTIN 100 MG PO CAPS
ORAL_CAPSULE | ORAL | Status: AC
  Filled 2019-10-27: qty 1

## 2019-10-27 MED ORDER — ACETAMINOPHEN 500 MG PO TABS
1000.0000 mg | ORAL_TABLET | Freq: Four times a day (QID) | ORAL | Status: DC
  Administered 2019-10-27 (×2): 1000 mg via ORAL
  Filled 2019-10-27: qty 2

## 2019-10-27 MED ORDER — ROPIVACAINE HCL 5 MG/ML IJ SOLN
INTRAMUSCULAR | Status: DC | PRN
  Administered 2019-10-27: 30 mL

## 2019-10-27 MED ORDER — SCOPOLAMINE 1 MG/3DAYS TD PT72
MEDICATED_PATCH | TRANSDERMAL | Status: DC | PRN
  Administered 2019-10-27: 1 via TRANSDERMAL

## 2019-10-27 MED ORDER — ONDANSETRON HCL 4 MG/2ML IJ SOLN
INTRAMUSCULAR | Status: DC | PRN
  Administered 2019-10-27: 4 mg via INTRAVENOUS

## 2019-10-27 MED ORDER — ACETAMINOPHEN 500 MG PO TABS: 1000.0000 mg | ORAL_TABLET | Freq: Four times a day (QID) | ORAL | 0 refills | Status: DC

## 2019-10-27 MED ORDER — SUGAMMADEX SODIUM 200 MG/2ML IV SOLN
INTRAVENOUS | Status: DC | PRN
  Administered 2019-10-27: 200 mg via INTRAVENOUS

## 2019-10-27 MED ORDER — KETOROLAC TROMETHAMINE 30 MG/ML IJ SOLN
INTRAMUSCULAR | Status: AC
  Filled 2019-10-27: qty 1

## 2019-10-27 MED ORDER — DOCUSATE SODIUM 100 MG PO CAPS: 100.0000 mg | ORAL_CAPSULE | Freq: Two times a day (BID) | ORAL | 0 refills | Status: DC

## 2019-10-27 MED ORDER — GABAPENTIN 100 MG PO CAPS
100.0000 mg | ORAL_CAPSULE | ORAL | Status: AC
  Administered 2019-10-27: 100 mg via ORAL
  Filled 2019-10-27: qty 1

## 2019-10-27 MED ORDER — ONDANSETRON HCL 4 MG PO TABS
4.0000 mg | ORAL_TABLET | Freq: Four times a day (QID) | ORAL | Status: DC | PRN
  Filled 2019-10-27: qty 1

## 2019-10-27 MED ORDER — FENTANYL CITRATE (PF) 100 MCG/2ML IJ SOLN
INTRAMUSCULAR | Status: AC
  Filled 2019-10-27: qty 2

## 2019-10-27 MED ORDER — ROCURONIUM BROMIDE 100 MG/10ML IV SOLN
INTRAVENOUS | Status: DC | PRN
  Administered 2019-10-27: 10 mg via INTRAVENOUS
  Administered 2019-10-27: 80 mg via INTRAVENOUS

## 2019-10-27 MED ORDER — ESTRADIOL 0.1 MG/24HR TD PTWK
0.1000 mg | MEDICATED_PATCH | TRANSDERMAL | Status: DC
  Administered 2019-10-27: 0.1 mg via TRANSDERMAL
  Filled 2019-10-27 (×2): qty 1

## 2019-10-27 MED ORDER — FENTANYL CITRATE (PF) 250 MCG/5ML IJ SOLN
INTRAMUSCULAR | Status: AC
  Filled 2019-10-27: qty 5

## 2019-10-27 MED ORDER — MIDAZOLAM HCL 2 MG/2ML IJ SOLN
INTRAMUSCULAR | Status: AC
  Filled 2019-10-27: qty 2

## 2019-10-27 MED ORDER — PROMETHAZINE HCL 25 MG/ML IJ SOLN
6.2500 mg | INTRAMUSCULAR | Status: DC | PRN
  Filled 2019-10-27: qty 1

## 2019-10-27 MED ORDER — INDIGOTINDISULFONATE SODIUM 8 MG/ML IJ SOLN
INTRAMUSCULAR | Status: DC | PRN
  Administered 2019-10-27: 5 mL via INTRAVENOUS

## 2019-10-27 MED ORDER — ACETAMINOPHEN 500 MG PO TABS
1000.0000 mg | ORAL_TABLET | ORAL | Status: AC
  Administered 2019-10-27: 1000 mg via ORAL
  Filled 2019-10-27: qty 2

## 2019-10-27 MED ORDER — DEXAMETHASONE SODIUM PHOSPHATE 10 MG/ML IJ SOLN
INTRAMUSCULAR | Status: AC
  Filled 2019-10-27: qty 1

## 2019-10-27 MED ORDER — KCL IN DEXTROSE-NACL 20-5-0.45 MEQ/L-%-% IV SOLN
INTRAVENOUS | Status: DC
  Administered 2019-10-27: 12:00:00 via INTRAVENOUS
  Filled 2019-10-27 (×3): qty 1000

## 2019-10-27 MED ORDER — SODIUM CHLORIDE 0.9 % IR SOLN
Status: DC | PRN
  Administered 2019-10-27: 3000 mL

## 2019-10-27 SURGICAL SUPPLY — 63 items
APPLICATOR ARISTA FLEXITIP XL (MISCELLANEOUS) ×3 IMPLANT
BLADE SURG 10 STRL SS (BLADE) IMPLANT
CABLE HIGH FREQUENCY MONO STRZ (ELECTRODE) IMPLANT
CANISTER SUCT 3000ML PPV (MISCELLANEOUS) ×3 IMPLANT
CELL SAVER LIPIGURD (MISCELLANEOUS) IMPLANT
COVER MAYO STAND STRL (DRAPES) ×3 IMPLANT
COVER TABLE BACK 60X90 (DRAPES) IMPLANT
COVER WAND RF STERILE (DRAPES) ×3 IMPLANT
DECANTER SPIKE VIAL GLASS SM (MISCELLANEOUS) ×9 IMPLANT
DERMABOND ADVANCED (GAUZE/BANDAGES/DRESSINGS) ×1
DERMABOND ADVANCED .7 DNX12 (GAUZE/BANDAGES/DRESSINGS) ×2 IMPLANT
DRSG COVADERM PLUS 2X2 (GAUZE/BANDAGES/DRESSINGS) ×9 IMPLANT
DRSG OPSITE POSTOP 3X4 (GAUZE/BANDAGES/DRESSINGS) ×3 IMPLANT
DURAPREP 26ML APPLICATOR (WOUND CARE) ×3 IMPLANT
EXTRT SYSTEM ALEXIS 14CM (MISCELLANEOUS)
EXTRT SYSTEM ALEXIS 17CM (MISCELLANEOUS)
GAUZE 4X4 16PLY RFD (DISPOSABLE) ×3 IMPLANT
GLOVE BIO SURGEON STRL SZ 6.5 (GLOVE) ×3 IMPLANT
GLOVE BIOGEL PI IND STRL 7.0 (GLOVE) ×8 IMPLANT
GLOVE BIOGEL PI INDICATOR 7.0 (GLOVE) ×4
GOWN STRL REUS W/TWL LRG LVL3 (GOWN DISPOSABLE) ×12 IMPLANT
HARMONIC RUM II 2.5CM SILVER (DISPOSABLE) ×3
HARMONIC RUM II 3.0CM SILVER (DISPOSABLE)
HARMONIC RUM II 3.5CM SILVER (DISPOSABLE)
HARMONIC RUM II 4.0CM SILVER (DISPOSABLE)
HEMOSTAT ARISTA ABSORB 3G PWDR (HEMOSTASIS) ×3 IMPLANT
LIGASURE VESSEL 5MM BLUNT TIP (ELECTROSURGICAL) ×3 IMPLANT
NEEDLE INSUFFLATION 120MM (ENDOMECHANICALS) ×3 IMPLANT
PACK LAPAROSCOPY BASIN (CUSTOM PROCEDURE TRAY) ×3 IMPLANT
PACK TRENDGUARD 450 HYBRID PRO (MISCELLANEOUS) IMPLANT
POUCH LAPAROSCOPIC INSTRUMENT (MISCELLANEOUS) ×3 IMPLANT
PROTECTOR NERVE ULNAR (MISCELLANEOUS) ×6 IMPLANT
RETRACTOR WOUND ALXS 19CM XSML (INSTRUMENTS) IMPLANT
RTRCTR WOUND ALEXIS 19CM XSML (INSTRUMENTS)
SCALPEL HRMNC RUM II 2.5 SILVR (DISPOSABLE) ×2 IMPLANT
SCALPEL HRMNC RUM II 3.0 SILVR (DISPOSABLE) IMPLANT
SCALPEL HRMNC RUM II 3.5 SILVR (DISPOSABLE) IMPLANT
SCALPEL HRMNC RUM II 4.0 SILVR (DISPOSABLE) IMPLANT
SCISSORS LAP 5X35 DISP (ENDOMECHANICALS) IMPLANT
SET CYSTO W/LG BORE CLAMP LF (SET/KITS/TRAYS/PACK) ×3 IMPLANT
SET IRRIG TUBING LAPAROSCOPIC (IRRIGATION / IRRIGATOR) ×3 IMPLANT
SET TRI-LUMEN FLTR TB AIRSEAL (TUBING) ×3 IMPLANT
SHEARS HARMONIC ACE PLUS 36CM (ENDOMECHANICALS) ×3 IMPLANT
SUT VIC AB 0 CT1 36 (SUTURE) ×3 IMPLANT
SUT VICRYL 0 UR6 27IN ABS (SUTURE) ×3 IMPLANT
SUT VICRYL 4-0 PS2 18IN ABS (SUTURE) ×3 IMPLANT
SUT VLOC 180 0 9IN  GS21 (SUTURE) ×1
SUT VLOC 180 0 9IN GS21 (SUTURE) ×2 IMPLANT
SYR 50ML LL SCALE MARK (SYRINGE) ×6 IMPLANT
SYSTEM CONTND EXTRCTN KII BLLN (MISCELLANEOUS) IMPLANT
TIP RUMI ORANGE 6.7MMX12CM (TIP) IMPLANT
TIP UTERINE 5.1X6CM LAV DISP (MISCELLANEOUS) ×3 IMPLANT
TIP UTERINE 6.7X10CM GRN DISP (MISCELLANEOUS) IMPLANT
TIP UTERINE 6.7X6CM WHT DISP (MISCELLANEOUS) IMPLANT
TIP UTERINE 6.7X8CM BLUE DISP (MISCELLANEOUS) IMPLANT
TOWEL OR 17X26 10 PK STRL BLUE (TOWEL DISPOSABLE) ×6 IMPLANT
TRAY FOLEY W/BAG SLVR 14FR (SET/KITS/TRAYS/PACK) ×3 IMPLANT
TRENDGUARD 450 HYBRID PRO PACK (MISCELLANEOUS)
TROCAR ADV FIXATION 5X100MM (TROCAR) ×3 IMPLANT
TROCAR PORT AIRSEAL 5X120 (TROCAR) ×3 IMPLANT
TROCAR XCEL NON BLADE 8MM B8LT (ENDOMECHANICALS) ×3 IMPLANT
TROCAR XCEL NON-BLD 5MMX100MML (ENDOMECHANICALS) ×3 IMPLANT
WARMER LAPAROSCOPE (MISCELLANEOUS) ×3 IMPLANT

## 2019-10-27 NOTE — Interval H&P Note (Signed)
History and Physical Interval Note:  10/27/2019 7:11 AM  Marguerita Beards  has presented today for surgery, with the diagnosis of BRCA 2 positive.  The various methods of treatment have been discussed with the patient and family. After consideration of risks, benefits and other options for treatment, the patient has consented to  Procedure(s) with comments: [place] (Bilateral) - Extended recovery bed needed CYSTOSCOPY (N/A) as a surgical intervention.  The patient's history has been reviewed, patient examined, no change in status, stable for surgery.  I have reviewed the patient's chart and labs.  Questions were answered to the patient's satisfaction.     Salvadore Dom

## 2019-10-27 NOTE — Transfer of Care (Signed)
Immediate Anesthesia Transfer of Care Note  Patient: Jasmin Duran  Procedure(s) Performed: Procedure(s) (LRB): TOTAL LAPAROSCOPIC HYSTERECTOMY WITH BILATERAL SALPINGO OOPHORECTOMY (Bilateral) CYSTOSCOPY (N/A)  Patient Location: PACU  Anesthesia Type: General  Level of Consciousness: awake, sedated, patient cooperative and responds to stimulation  Airway & Oxygen Therapy: Patient Spontanous Breathing and Patient connected to [place] 02 and soft FM  Post-op Assessment: Report given to PACU RN, Post -op Vital signs reviewed and stable and Patient moving all extremities  Post vital signs: Reviewed and stable  Complications: No apparent anesthesia complications

## 2019-10-27 NOTE — Anesthesia Postprocedure Evaluation (Signed)
Anesthesia Post Note  Patient: Tenessa Marsee  Procedure(s) Performed: TOTAL LAPAROSCOPIC HYSTERECTOMY WITH BILATERAL SALPINGO OOPHORECTOMY (Bilateral ) CYSTOSCOPY (N/A )     Patient location during evaluation: PACU Anesthesia Type: General Level of consciousness: awake and alert Pain management: pain level controlled Vital Signs Assessment: post-procedure vital signs reviewed and stable Respiratory status: spontaneous breathing, nonlabored ventilation and respiratory function stable Cardiovascular status: blood pressure returned to baseline and stable Postop Assessment: no apparent nausea or vomiting Anesthetic complications: no    Last Vitals:  Vitals:   10/27/19 1030 10/27/19 1111  BP: 137/83 (!) 141/89  Pulse: 89 90  Resp: 16 16  Temp: 36.6 C 36.4 C  SpO2: 97% 99%    Last Pain:  Vitals:   10/27/19 1111  TempSrc:   PainSc: 1                  Lynda Rainwater

## 2019-10-27 NOTE — Anesthesia Preprocedure Evaluation (Signed)
Anesthesia Evaluation  Patient identified by MRN, date of birth, ID band Patient awake    Reviewed: Allergy & Precautions, NPO status , Patient's Chart, lab work & pertinent test results  History of Anesthesia Complications (+) PONV  Airway Mallampati: II  TM Distance: >3 FB Neck ROM: Full    Dental no notable dental hx.    Pulmonary asthma ,    Pulmonary exam normal breath sounds clear to auscultation       Cardiovascular negative cardio ROS Normal cardiovascular exam Rhythm:Regular Rate:Normal     Neuro/Psych Anxiety negative neurological ROS  negative psych ROS   GI/Hepatic negative GI ROS, Neg liver ROS,   Endo/Other  negative endocrine ROS  Renal/GU negative Renal ROS  negative genitourinary   Musculoskeletal negative musculoskeletal ROS (+)   Abdominal   Peds negative pediatric ROS (+)  Hematology negative hematology ROS (+)   Anesthesia Other Findings BRCA 2 Mutation  Reproductive/Obstetrics negative OB ROS                             Anesthesia Physical Anesthesia Plan  ASA: II  Anesthesia Plan: General   Post-op Pain Management:    Induction: Intravenous  PONV Risk Score and Plan: 4 or greater and Ondansetron, Dexamethasone, Midazolam, Scopolamine patch - Pre-op and Treatment may vary due to age or medical condition  Airway Management Planned: Oral ETT  Additional Equipment:   Intra-op Plan:   Post-operative Plan: Extubation in OR  Informed Consent: I have reviewed the patients History and Physical, chart, labs and discussed the procedure including the risks, benefits and alternatives for the proposed anesthesia with the patient or authorized representative who has indicated his/her understanding and acceptance.     Dental advisory given  Plan Discussed with: CRNA  Anesthesia Plan Comments:         Anesthesia Quick Evaluation

## 2019-10-27 NOTE — Anesthesia Procedure Notes (Signed)
Procedure Name: Intubation Date/Time: 10/27/2019 7:27 AM Performed by: Justice Rocher, CRNA Pre-anesthesia Checklist: Patient identified, Emergency Drugs available, Suction available and Patient being monitored Patient Re-evaluated:Patient Re-evaluated prior to induction Oxygen Delivery Method: Circle system utilized Preoxygenation: Pre-oxygenation with 100% oxygen Induction Type: IV induction Ventilation: Mask ventilation without difficulty Laryngoscope Size: Mac and 3 Grade View: Grade II Tube type: Oral Tube size: 7.5 mm Number of attempts: 1 Airway Equipment and Method: Stylet and Oral airway Placement Confirmation: ETT inserted through vocal cords under direct vision,  positive ETCO2 and breath sounds checked- equal and bilateral Secured at: 23 cm Tube secured with: Tape Dental Injury: Teeth and Oropharynx as per pre-operative assessment

## 2019-10-27 NOTE — Op Note (Signed)
Preoperative Diagnosis: BRCA 2 gene mutation  Postoperative Diagnosis: Same  Procedure:  Total Laparoscopic Hysterectomy with bilateral salpingo-oophorectomy, lysis of adhesions and cystoscopy  Surgeon: Dr Sumner Boast  Assistant: Dr Josefa Half, an MD assistant was necessary for tissue manipulation, retraction and positioning due to the complexity of the case and hospital policies   Anesthesia: General  EBL: 50  Fluids: 2,000 cc LR  Urine output: 143 cc  Complications: none  Consultation: Dr Tammi Klippel from Urology came to see the bladder lesion and will arrange post operative follow up.  Specimen: pelvic washings, uterus with cervix, bilateral tubes and ovaries (sent for evaluation with SEE-FIM protocol).  Indications for surgery: The patient is a 48 year old female, who presented with BRCA 2 gene mutation. Work up included a normal pelvic ultrasound and normal CA 125. She has a prior h/o HPV, most recent pap and HPV were negative in 9/20. We discussed the risks and benefits of hysterectomy in addition to salpingo-oophorectomy.  The patient is aware of the risks and complications involved with the surgery and consent was obtained prior to the procedure.  Findings: Normal sized uterus, normal adnexa bilaterally, normal liver edge, normal appendix, normal inspection of the peritoneum. There were adhesions of the bowel to the left pelvic side wall just above the infundibulopelvic ligament. There were some adhesions of the bowel to the right mid abdominal wall. Cystoscopy with bilateral ureteral jets. There is a small papillary lesion of the bladder just medial to the left ureteral orifice.   Procedure: The patient was taken to the operating room with an IV in placed, preoperative antibiotics had been administered. She was placed in the dorsal lithotomy position. General anesthesia was administered. She was prepped and draped in the usual sterile fashion for an abdominal, vaginal surgery. A  rumi uterine manipulator was placed, using a # 2.5 cup and a 6 cm extender. A foley catheter was placed.    The umbilicus was everted, injected with 0.25% marcaine and incised with a # 11 blade. 2 towel clips were used to elevated the umbilicus and a veress needle was placed into the abdominal cavity. The abdominal cavity was insufflated with CO2, with normal intraabdominal pressures. After adequate pneumo-insufflation the veress needle was removed and the 5 mm laparoscope was placed into the abdominal cavity using the opti-view trocar. The patient was placed in trendelenburg and the abdominal pelvic cavity was inspected. 3 more trocars were placed: 1 in each lower quadrant approximately 3 cm medial to and superior to the anterior superior iliac spine and one in the midline approximately 6 cm above the pubic symphysis in the midline. These areas were injected with 0.25% marcaine, incised with a #11 blade and all trocars were inserted with direct visualization with the laparoscope. A # 5 airseal trocar was placed in the RLQ, a 5 mm trocar in the LLQ and a #8 trocar in the midline. The abdominal pelvic cavity was again inspected. Pelvic washings were obtained. A mixture of 30 cc of Robivacaine and 30 cc of NS was place in the pelvic cavity.   The adhesions of the bowel to the pelvic side wall were taken down with the harmonic scalpel and blunt dissection. The left ureter could not be identified on the pelvic side wall. The retroperitoneal space lateral to the left ureter was entered and IP ligament was clearly away from the ureter. The left infundibulopelvic ligament was elevated from the pelvic sidewall, cauterized and cut with the ligasure device. The mesosalpinx and  mesoovarium was cauterized and cut with the ligasure device. The left round ligament was cauterized and cut with the ligasure device and the anterior and posterior leafs of the broad ligament were taken down with the ligasure device. The harmonic  scalpel was then used to take down the bladder flap and skeltonize the vessels. The left uterine vessels were then clamped, cauterized and ligated with the ligasure device. Hemostasis was excellent. The right ureter was identified on the pelvic side wall. same procedure was repeated on the right.   The right infundibulopelvic ligament was elevated from the pelvic sidewall, cauterized and cut with the ligasure device. The mesosalpinx and mesoovarium was cauterized and cut with the ligasure device. The right round ligament was cauterized and cut with the ligasure device and the anterior and posterior leafs of the broad ligament were taken down with the ligasure device. The harmonic scalpel was then used to take down the bladder flap and skeltonize the vessels. The right uterine vessels were then clamped, cauterized and ligated with the ligasure device  Using the rumi manipulator the uterus was pushed up in the pelvic cavity and the harmonic scalpel was used to separate the cervix from the vagina using the harmonic energy. The uterus was  removed vaginally at this time. An occluder was placed in the vagina to maintain pneumoperitoneum. The vaginal cuff was then closed with a 0 V-lock suture. Hemostasis was excellent. The abdominal pelvic cavity was irrigated and suctioned dry. Pressure was released and hemostasis remained excellent. Arista was placed over the vaginal cuff and pelvic side wall.   The abdominal cavity was desufflated and the trocars were removed. The skin was closed with subcuticular stiches of 4-0 vicryl and dermabond was placed over the incisions.  The foley catheter was removed and cystoscopy was performed using a 70 degree scope. The right ureter expelled urine, the left ureter was less clear. Indigo carmine was given intravenously and then both ureters clearly expelled urine. Dr Tammi Klippel came and offered his opinion on the bladder lesion. The bladder was allowed to drain and the cystoscope was  removed.   The patient's abdomen and perineum were cleansed and she was taken out of the dorsal lithotomy position. Upon awakening she was extubated and taken to the recovery room in stable condition. The sponge and instrument counts were correct.

## 2019-10-27 NOTE — Discharge Instructions (Signed)
Post Anesthesia Home Care Instructions  Activity: Get plenty of rest for the remainder of the day. A responsible individual must stay with you for 24 hours following the procedure.  For the next 24 hours, DO NOT: -Drive a car -Paediatric nurse -Drink alcoholic beverages -Take any medication unless instructed by your physician -Make any legal decisions or sign important papers.  Meals: Start with liquid foods such as gelatin or soup. Progress to regular foods as tolerated. Avoid greasy, spicy, heavy foods. If nausea and/or vomiting occur, drink only clear liquids until the nausea and/or vomiting subsides. Call your physician if vomiting continues.  Special Instructions/Symptoms: Your throat may feel dry or sore from the anesthesia or the breathing tube placed in your throat during surgery. If this causes discomfort, gargle with warm salt water. The discomfort should disappear within 24 hours.  If you had a scopolamine patch placed behind your ear for the management of post- operative nausea and/or vomiting:  1. The medication in the patch is effective for 72 hours, after which it should be removed.  Wrap patch in a tissue and discard in the trash. Wash hands thoroughly with soap and water. 2. You may remove the patch earlier than 72 hours if you experience unpleasant side effects which may include dry mouth, dizziness or visual disturbances. 3. Avoid touching the patch. Wash your hands with soap and water after contact with the patch.  DISCHARGE INSTRUCTIONS: Laparoscopy  The following instructions have been prepared to help you care for yourself upon your return home today.  Wound care:  Do not get the incision wet for the first 24 hours. The incision should be kept clean and dry.  The Band-Aids or dressings may be removed the day after surgery.  Should the incision become sore, red, and swollen after the first week, check with your doctor.  Personal hygiene:  Shower the day  after your procedure.  Activity and limitations:  Do NOT drive or operate any equipment today.  Do NOT lift anything more than 15 pounds for 2-3 weeks after surgery.  Do NOT rest in bed all day.  Walking is encouraged. Walk each day, starting slowly with 5-minute walks 3 or 4 times a day. Slowly increase the length of your walks.  Walk up and down stairs slowly.  Do NOT do strenuous activities, such as golfing, playing tennis, bowling, running, biking, weight lifting, gardening, mowing, or vacuuming for 2-4 weeks. Ask your doctor when it is okay to start.  Diet: Eat a light meal as desired this evening. You may resume your usual diet tomorrow.  Return to work: This is dependent on the type of work you do. For the most part you can return to a desk job within a week of surgery. If you are more active at work, please discuss this with your doctor.  What to expect after your surgery: You may have a slight burning sensation when you urinate on the first day. You may have a very small amount of blood in the urine. Expect to have a small amount of vaginal discharge/light bleeding for 1-2 weeks. It is not unusual to have abdominal soreness and bruising for up to 2 weeks. You may be tired and need more rest for about 1 week. You may experience shoulder pain for 24-72 hours. Lying flat in bed may relieve it.  Call your doctor for any of the following:  Develop a fever of 100.4 or greater  Inability to urinate 6 hours after discharge from  hospital  Severe pain not relieved by pain medications  Persistent of heavy bleeding at incision site  Redness or swelling around incision site after a week  Increasing nausea or vomiting  Laparoscopically Assisted Vaginal Hysterectomy, Care After This sheet gives you information about how to care for yourself after your procedure. Your health care provider may also give you more specific instructions. If you have problems or questions, contact your  health care provider. What can I expect after the procedure? After the procedure, it is common to have:  Soreness and numbness in your incision areas.  Abdominal pain. You will be given pain medicine to control it.  Vaginal bleeding and discharge. You will need to use a sanitary napkin after this procedure.  Sore throat from the breathing tube that was inserted during surgery. Follow these instructions at home: Medicines  Take over-the-counter and prescription medicines only as told by your health care provider.  Do not take aspirin or ibuprofen. These medicines can cause bleeding.  Do not drive or use heavy machinery while taking prescription pain medicine.  Do not drive for 24 hours if you were given a medicine to help you relax (sedative) during the procedure. Incision care   Follow instructions from your health care provider about how to take care of your incisions. Make sure you: ? Wash your hands with soap and water before you change your bandage (dressing). If soap and water are not available, use hand sanitizer. ? Change your dressing as told by your health care provider. ? Leave stitches (sutures), skin glue, or adhesive strips in place. These skin closures may need to stay in place for 2 weeks or longer. If adhesive strip edges start to loosen and curl up, you may trim the loose edges. Do not remove adhesive strips completely unless your health care provider tells you to do that.  Check your incision area every day for signs of infection. Check for: ? Redness, swelling, or pain. ? Fluid or blood. ? Warmth. ? Pus or a bad smell. Activity  Get regular exercise as told by your health care provider. You may be told to take short walks every day and go farther each time.  Return to your normal activities as told by your health care provider. Ask your health care provider what activities are safe for you.  Do not douche, use tampons, or have sexual intercourse for at least  6 weeks, or until your health care provider gives you permission.  Do not lift anything that is heavier than 10 lb (4.5 kg), or the limit that your health care provider tells you, until he or she says that it is safe. General instructions  Do not take baths, swim, or use a hot tub until your health care provider approves. Take showers instead of baths.  Do not drive for 24 hours if you received a sedative.  Do not drive or operate heavy machinery while taking prescription pain medicine.  To prevent or treat constipation while you are taking prescription pain medicine, your health care provider may recommend that you: ? Drink enough fluid to keep your urine clear or pale yellow. ? Take over-the-counter or prescription medicines. ? Eat foods that are high in fiber, such as fresh fruits and vegetables, whole grains, and beans. ? Limit foods that are high in fat and processed sugars, such as fried and sweet foods.  Keep all follow-up visits as told by your health care provider. This is important. Contact a health care provider  if:  You have signs of infection, such as: ? Redness, swelling, or pain around your incision sites. ? Fluid or blood coming from an incision. ? An incision that feels warm to the touch. ? Pus or a bad smell coming from an incision.  Your incision breaks open.  Your pain medicine is not helping.  You feel dizzy or light-headed.  You have pain or bleeding when you urinate.  You have persistent nausea and vomiting.  You have blood, pus, or a bad-smelling discharge from your vagina. Get help right away if:  You have a fever.  You have severe abdominal pain.  You have chest pain.  You have shortness of breath.  You faint.  You have pain, swelling, or redness in your leg.  You have heavy bleeding from your vagina. Summary  After the procedure, it is common to have abdominal pain and vaginal bleeding.  You should not drive or lift heavy objects  until your health care provider says that it is safe.  Contact your health care provider if you have any symptoms of infection, excessive vaginal bleeding, nausea, vomiting, or shortness of breath. This information is not intended to replace advice given to you by your health care provider. Make sure you discuss any questions you have with your health care provider. Document Released: 11/29/2011 Document Revised: 11/22/2017 Document Reviewed: 02/05/2017 Elsevier Patient Education  2020 [employer].

## 2019-10-27 NOTE — Discharge Summary (Signed)
Physician Discharge Summary  Patient ID: Jovani Colquhoun MRN: [id number] DOB/AGE: Aug 26, 1971 48 y.o.  Admit date: 10/27/2019 Discharge date: 10/27/2019  Admission Diagnoses: BRCA 2 Gene Mutation   Discharge Diagnoses:  Active Problems:   Status post laparoscopic hysterectomy   Discharged Condition: good  Hospital Course: Uncomplicated  Consults: urology, intraoperatively for bladder lesion  Significant Diagnostic Studies: labs:  Lab Results  Component Value Date   WBC 8.5 10/27/2019   HGB 12.9 10/27/2019   HCT 38.1 10/27/2019   MCV 93.6 10/27/2019   PLT 282 10/27/2019     Treatments: surgery: total laparoscopic hysterectomy, bilateral salpingo-oophorectomy, lysis of adhesions, cystoscopy  The patient is feeling great, not taking any narcotics. Declines narcotics, won't use NSAID's, plans to take tylenol. Will call if she changes her mind.  Discharge Exam: Blood pressure (!) 155/85, pulse 98, temperature 98.1 F (36.7 C), resp. rate 16, height '5\' 8"'  (1.727 m), weight 77.7 kg, SpO2 98 %.  Today's Vitals   10/27/19 1111 10/27/19 1215 10/27/19 1300 10/27/19 1704  BP: (!) 141/89 136/86  (!) 155/85  Pulse: 90 96  98  Resp: '16 14  16  ' Temp: 97.6 F (36.4 C) 98 F (36.7 C)  98.1 F (36.7 C)  TempSrc:      SpO2: 99% 100%  98%  Weight:      Height:      PainSc: 1  1  0-No pain 0-No pain    General appearance: alert, cooperative and no distress Resp: clear to auscultation bilaterally Cardio: regular rate and rhythm GI: soft, non-tender; bowel sounds normal; no masses,  no organomegaly Extremities: extremities normal, atraumatic, no cyanosis or edema Incision/Wound: clean, dry and intact without erythema  Disposition: Discharge disposition: 01-Home or Self Care       Discharge Instructions    Call MD for:   Complete by: As directed    Heavy vaginal bleeding   Call MD for:  difficulty breathing, headache or visual disturbances   Complete by: As directed     Call MD for:  extreme fatigue   Complete by: As directed    Call MD for:  hives   Complete by: As directed    Call MD for:  persistant dizziness or light-headedness   Complete by: As directed    Call MD for:  persistant nausea and vomiting   Complete by: As directed    Call MD for:  redness, tenderness, or signs of infection (pain, swelling, redness, odor or green/yellow discharge around incision site)   Complete by: As directed    Call MD for:  severe uncontrolled pain   Complete by: As directed    Call MD for:  temperature >100.4   Complete by: As directed    Diet general   Complete by: As directed    Driving Restrictions   Complete by: As directed    No driving until you can slam on the brakes of the car   Increase activity slowly   Complete by: As directed    May shower / Bathe   Complete by: As directed    Can shower mid day tomorrow. No tub bath for 2 weeks   Sexual Activity Restrictions   Complete by: As directed    No intercourse for 12 weeks     Allergies as of 10/27/2019   No Known Allergies     Medication List    STOP taking these medications   etonogestrel-ethinyl estradiol 0.12-0.015 MG/24HR vaginal ring Commonly known as: NuvaRing  TAKE these medications   acetaminophen 500 MG tablet Commonly known as: TYLENOL Take 2 tablets (1,000 mg total) by mouth every 6 (six) hours. Start taking on: October 28, 2019   albuterol 108 (90 Base) MCG/ACT inhaler Commonly known as: VENTOLIN HFA Inhale 2 puffs, 15 minutes before physical activity   clonazePAM 0.5 MG tablet Commonly known as: KLONOPIN Take 0.5-1 mg by mouth at bedtime.   docusate sodium 100 MG capsule Commonly known as: COLACE Take 1 capsule (100 mg total) by mouth 2 (two) times daily.   estradiol 0.1 MG/24HR patch Commonly known as: Vivelle-Dot Place 1 patch (0.1 mg total) onto the skin 2 (two) times a week. Start taking on: October 29, 2019        Signed: Salvadore Dom 10/27/2019, 5:41 PM

## 2019-10-28 ENCOUNTER — Encounter (HOSPITAL_BASED_OUTPATIENT_CLINIC_OR_DEPARTMENT_OTHER): Payer: Self-pay | Admitting: Obstetrics and Gynecology

## 2019-10-28 LAB — CYTOLOGY - NON PAP

## 2019-10-30 LAB — SURGICAL PATHOLOGY

## 2019-11-02 ENCOUNTER — Other Ambulatory Visit: Payer: Self-pay

## 2019-11-02 NOTE — Progress Notes (Signed)
GYNECOLOGY  VISIT   HPI: 48 y.o.   Married White or Caucasian Not Hispanic or Latino  female   G0P0 with Jasmin Duran's last menstrual period was 09/24/2019 (exact date).   here for 1 week post op s/p TLH/BSO, bening pathology. She was started on the 0.1 mg estradiol patch. She is having some tolerable vasomotor symptoms     GYNECOLOGIC HISTORY: Jasmin Duran's last menstrual period was 09/24/2019 (exact date). Contraception: Hysterectomy Menopausal hormone therapy: Vivelle Dot patch        OB History    Gravida  0   Para      Term      Preterm      AB      Living        SAB      TAB      Ectopic      Multiple      Live Births                 Jasmin Duran Active Problem List   Diagnosis Date Noted  . Status post laparoscopic hysterectomy 10/27/2019  . Genetic testing 10/08/2019  . BRCA2 gene mutation positive 10/08/2019  . Family history of breast cancer   . Family history of ovarian cancer   . Family history of BRCA2 gene positive   . 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 Restless leg syndrome 02/13/2018  . Chronic use of benzodiazepine for therapeutic purpose 02/13/2018  . Elevated blood pressure reading 02/13/2018  . Primary insomnia 07/10/2013    Past Medical History:  Diagnosis Date  . Abnormal Pap smear of cervix    8/16 ASCUS HPV HR +  . Anxiety   . Asthma    laughing, cold drink, exercise  . Cold induced bronchospasm   . Family history of BRCA2 gene positive   . Family history of breast cancer   . Family history of ovarian cancer   . Insomnia   . PONV (postoperative nausea and vomiting)    Positional nausea  . Restless legs syndrome (RLS)     Past Surgical History:  Procedure Laterality Date  . ABDOMINAL HYSTERECTOMY    . CYSTOSCOPY N/A 10/27/2019   Procedure: CYSTOSCOPY;  Surgeon: Salvadore Dom, MD;  Location: [facility];  Service: Gynecology;  Laterality: N/A;  . TOTAL LAPAROSCOPIC HYSTERECTOMY WITH BILATERAL SALPINGO  OOPHORECTOMY Bilateral 10/27/2019   Procedure: TOTAL LAPAROSCOPIC HYSTERECTOMY WITH BILATERAL SALPINGO OOPHORECTOMY;  Surgeon: Salvadore Dom, MD;  Location: [facility];  Service: Gynecology;  Laterality: Bilateral;  Extended recovery bed needed  . WISDOM TOOTH EXTRACTION      Current Outpatient Medications  Medication Sig Dispense Refill  . albuterol (PROVENTIL HFA;VENTOLIN HFA) 108 (90 Base) MCG/ACT inhaler Inhale 2 puffs, 15 minutes before physical activity 1 Inhaler 2  . clonazePAM (KLONOPIN) 0.5 MG tablet Take 0.5-1 mg by mouth at bedtime.     Marland Kitchen estradiol (VIVELLE-DOT) 0.1 MG/24HR patch Place 1 patch (0.1 mg total) onto the skin 2 (two) times a week. 24 patch 1   No current facility-administered medications for this visit.      ALLERGIES: Jasmin Duran has no known allergies.  Family History  Problem Relation Age of Onset  . Breast cancer Maternal Grandmother        dx younger than 62  . Cancer Maternal Grandfather        lung cancer  . Asthma Father   . Asthma Paternal Grandfather   . Asthma Maternal Aunt   .  Ovarian cancer Mother        stage 91  . Cancer Mother        stomach wall  . BRCA 1/2 Mother        BRCA2 +  . Cerebral palsy Sister   . COPD Paternal 45   . Asthma Paternal Aunt   . Breast cancer Other        d. 60  . Asthma Paternal Aunt     Social History   Socioeconomic History  . Marital status: Married    Spouse name: Not on file  . Number of children: Not on file  . Years of education: Not on file  . Highest education level: Not on file  Occupational History  . Not on file  Social Needs  . Financial resource strain: Not on file  . Food insecurity    Worry: Not on file    Inability: Not on file  . Transportation needs    Medical: Not on file    Non-medical: Not on file  Tobacco Use  . Smoking status: Never Smoker  . Smokeless tobacco: Never Used  Substance and Sexual Activity  . Alcohol use: No  . Drug use: No  . Sexual  activity: Not Currently    Partners: Male    Birth control/protection: Surgical  Lifestyle  . Physical activity    Days per week: Not on file    Minutes per session: Not on file  . Stress: Not on file  Relationships  . Social Herbalist on phone: Not on file    Gets together: Not on file    Attends religious service: Not on file    Active member of club or organization: Not on file    Attends meetings of clubs or organizations: Not on file    Relationship status: Not on file  . Intimate partner violence    Fear of current or ex partner: Not on file    Emotionally abused: Not on file    Physically abused: Not on file    Forced sexual activity: Not on file  Other Topics Concern  . Not on file  Social History Narrative  . Not on file    Review of Systems  Constitutional: Negative.   HENT: Negative.   Eyes: Negative.   Respiratory: Negative.   Cardiovascular: Negative.   Gastrointestinal: Negative.   Genitourinary: Negative.   Musculoskeletal: Negative.   Skin: Negative.   Neurological: Negative.   Endo/Heme/Allergies: Negative.   Psychiatric/Behavioral: Negative.     PHYSICAL EXAMINATION:    BP 120/72 (BP Location: Right Arm, Jasmin Duran Position: Sitting, Cuff Size: Normal)   Pulse 84   Temp (!) 97.2 F (36.2 C) (Skin)   Wt 175 lb 3.2 oz (79.5 kg)   LMP 09/24/2019 (Exact Date)   BMI 26.64 kg/m     General appearance: alert, cooperative and appears stated age Abdomen: soft, non-tender; non distended, no masses,  no organomegaly Incisions: healing well   ASSESSMENT 1 week post op s/p TLH/BSO for BRCA 2 gene mutation. Negative pathology Vasomotor symptoms tolerable on the 0.1 mg estradiol patch   PLAN F/U in 3 weeks for post op Will plan to wean her off estrogen over time   An After Visit Summary was printed and given to the Jasmin Duran.

## 2019-11-02 NOTE — Progress Notes (Signed)
[facility] CONSULT NOTE  Patient Care Team: Derenda Mis, MD as PCP - General (Internal Medicine) Regina Eck, CNM as Consulting Physician (Certified Nurse Midwife)  CHIEF COMPLAINTS/PURPOSE OF CONSULTATION:  Newly diagnosed high risk for breast cancer, BRCA2 mutation positive  HISTORY OF PRESENTING ILLNESS:  Jasmin Duran 49 y.o. female is here because of recent diagnosis of high risk for breast cancer and BRCA2 mutation positive. She underwent a laparoscopic total hysterectomy with bilateral salpingo-oophorectomy on 10/27/19. Her most recent mammogram on 09/08/19 showed no evidence of malignancy bilaterally. She presents to the clinic today for initial evaluation and discussion of surveillance options.  She is sore from the recent surgery for hysterectomy and bilateral salpingo-oophorectomy. She has 1 sister who has cerebral palsy and lives in [place]. Her mother has advanced ovarian cancer with pelvic carcinomatosis. She does not have any children.  She works for [employer] by working from home.  I reviewed her records extensively and collaborated the history with the patient.  MEDICAL HISTORY:  Past Medical History:  Diagnosis Date  . Abnormal Pap smear of cervix    8/16 ASCUS HPV HR +  . Anxiety   . Asthma    laughing, cold drink, exercise  . Cold induced bronchospasm   . Family history of BRCA2 gene positive   . Family history of breast cancer   . Family history of ovarian cancer   . Insomnia   . PONV (postoperative nausea and vomiting)    Positional nausea  . Restless legs syndrome (RLS)     SURGICAL HISTORY: Past Surgical History:  Procedure Laterality Date  . CYSTOSCOPY N/A 10/27/2019   Procedure: CYSTOSCOPY;  Surgeon: Salvadore Dom, MD;  Location: [facility];  Service: Gynecology;  Laterality: N/A;  . TOTAL LAPAROSCOPIC HYSTERECTOMY WITH BILATERAL SALPINGO OOPHORECTOMY Bilateral 10/27/2019   Procedure:  TOTAL LAPAROSCOPIC HYSTERECTOMY WITH BILATERAL SALPINGO OOPHORECTOMY;  Surgeon: Salvadore Dom, MD;  Location: [facility];  Service: Gynecology;  Laterality: Bilateral;  Extended recovery bed needed  . WISDOM TOOTH EXTRACTION      SOCIAL HISTORY: Social History   Socioeconomic History  . Marital status: Married    Spouse name: Not on file  . Number of children: Not on file  . Years of education: Not on file  . Highest education level: Not on file  Occupational History  . Not on file  Social Needs  . Financial resource strain: Not on file  . Food insecurity    Worry: Not on file    Inability: Not on file  . Transportation needs    Medical: Not on file    Non-medical: Not on file  Tobacco Use  . Smoking status: Never Smoker  . Smokeless tobacco: Never Used  Substance and Sexual Activity  . Alcohol use: No  . Drug use: No  . Sexual activity: Yes    Partners: Male    Birth control/protection: Inserts    Comment: nuvaring  Lifestyle  . Physical activity    Days per week: Not on file    Minutes per session: Not on file  . Stress: Not on file  Relationships  . Social Herbalist on phone: Not on file    Gets together: Not on file    Attends religious service: Not on file    Active member of club or organization: Not on file    Attends meetings of clubs or organizations: Not on file  Relationship status: Not on file  . Intimate partner violence    Fear of current or ex partner: Not on file    Emotionally abused: Not on file    Physically abused: Not on file    Forced sexual activity: Not on file  Other Topics Concern  . Not on file  Social History Narrative  . Not on file    FAMILY HISTORY: Family History  Problem Relation Age of Onset  . Breast cancer Maternal Grandmother        dx younger than 32  . Cancer Maternal Grandfather        lung cancer  . Asthma Father   . Asthma Paternal Grandfather   . Asthma Maternal Aunt   .  Ovarian cancer Mother        stage 72  . Cancer Mother        stomach wall  . BRCA 1/2 Mother        BRCA2 +  . Cerebral palsy Sister   . COPD Paternal 86   . Asthma Paternal Aunt   . Breast cancer Other        d. 65  . Asthma Paternal Aunt     ALLERGIES:  has No Known Allergies.  MEDICATIONS:  Current Outpatient Medications  Medication Sig Dispense Refill  . acetaminophen (TYLENOL) 500 MG tablet Take 2 tablets (1,000 mg total) by mouth every 6 (six) hours. 30 tablet 0  . albuterol (PROVENTIL HFA;VENTOLIN HFA) 108 (90 Base) MCG/ACT inhaler Inhale 2 puffs, 15 minutes before physical activity 1 Inhaler 2  . clonazePAM (KLONOPIN) 0.5 MG tablet Take 0.5-1 mg by mouth at bedtime.     . docusate sodium (COLACE) 100 MG capsule Take 1 capsule (100 mg total) by mouth 2 (two) times daily. 10 capsule 0  . estradiol (VIVELLE-DOT) 0.1 MG/24HR patch Place 1 patch (0.1 mg total) onto the skin 2 (two) times a week. 24 patch 1   No current facility-administered medications for this visit.     REVIEW OF SYSTEMS:   Constitutional: Denies fevers, chills or abnormal night sweats Eyes: Denies blurriness of vision, double vision or watery eyes Ears, nose, mouth, throat, and face: Denies mucositis or sore throat Respiratory: Denies cough, dyspnea or wheezes Cardiovascular: Denies palpitation, chest discomfort or lower extremity swelling Gastrointestinal:  Denies nausea, heartburn or change in bowel habits Skin: Denies abnormal skin rashes Lymphatics: Denies new lymphadenopathy or easy bruising Neurological:Denies numbness, tingling or new weaknesses Behavioral/Psych: Mood is stable, no new changes  Breast: Denies any palpable lumps or discharge All other systems were reviewed with the patient and are negative.  PHYSICAL EXAMINATION: ECOG PERFORMANCE STATUS: 1 - Symptomatic but completely ambulatory  Vitals:   11/03/19 1254  BP: (!) 149/94  Pulse: 95  Resp: 16  Temp: 98.5 F (36.9 C)   SpO2: 100%   Filed Weights   11/03/19 1254  Weight: 175 lb 14.4 oz (79.8 kg)    GENERAL:alert, no distress and comfortable SKIN: skin color, texture, turgor are normal, no rashes or significant lesions EYES: normal, conjunctiva are pink and non-injected, sclera clear OROPHARYNX:no exudate, no erythema and lips, buccal mucosa, and tongue normal  NECK: supple, thyroid normal size, non-tender, without nodularity LYMPH:  no palpable lymphadenopathy in the cervical, axillary or inguinal LUNGS: clear to auscultation and percussion with normal breathing effort HEART: regular rate & rhythm and no murmurs and no lower extremity edema ABDOMEN: Recent hysterectomy and bilateral salpingo-oophorectomy Musculoskeletal:no cyanosis of digits and no clubbing  PSYCH: alert & oriented x 3 with fluent speech NEURO: no focal motor/sensory deficits BREAST: No palpable nodules in breast. No palpable axillary or supraclavicular lymphadenopathy (exam performed in the presence of a chaperone)   LABORATORY DATA:  I have reviewed the data as listed Lab Results  Component Value Date   WBC 8.5 10/27/2019   HGB 12.9 10/27/2019   HCT 38.1 10/27/2019   MCV 93.6 10/27/2019   PLT 282 10/27/2019   Lab Results  Component Value Date   NA 140 10/23/2019   K 3.8 10/23/2019   CL 108 10/23/2019   CO2 23 10/23/2019    RADIOGRAPHIC STUDIES: I have personally reviewed the radiological reports and agreed with the findings in the report.  ASSESSMENT AND PLAN:  BRCA2 gene mutation positive BRCA2 gene mutation: I discussed with the patient that BRCA2 is a tumor suppressor gene which helps repair damaged DNA or destroy cells if they cannot be repaired. In patients with BRCA1 or 2 mutations, the damaged DNA could not be repaired properly increasing the risk of cancers. BRCA2 gene is located on long arm of chromosome 13. These mutations are inherited in autosomal dominant fashion and hence 50% probability that their  children may have a BRCA mutation.  Cancer risk:  Average to risk of cancer by age of 33: (Antoniou et al pooled pedigree data from 65 studies of 8139 index patients with breast or ovarian cancer) Breast cancer risk: 45 percent (95% CI, 33 to 54 percent) Ovarian cancer risk: 11 percent (95% CI, 4.1 to 18 percent)  Other cancer risks:  1. Pancreatic cancer (relative risk is 3.51) with incidence of 4.9% in BRCA2 carriers 2. Colorectal cancers: In many studies there was no increased risk But there may be some for BRCA1 carriers 3. Melanoma and other skin cancers: The risk of oatmeal melanoma is increase in BRCA2 mutation carriers but it still extremely rare.  Breast cancer risk reduction/surveillance: 1. Annual mammogram and breast MRI are recommended by [facility] Mammogram 09/08/2019: Benign Breast MRI will be scheduled for March 2021 Breast exam 11/03/2019: Benign  2. Chemoprevention with tamoxifen-like agents is not entirely clear.   Ovarian cancer risk reduction: Patient underwent risk reducing bilateral salpingo-oophorectomy and hysterectomy November 2020. Return to clinic in 1 year for follow-up    All questions were answered. The patient knows to call the clinic with any problems, questions or concerns.   Rulon Eisenmenger, MD, MPH 11/03/2019    I, Molly Dorshimer, am acting as scribe for Nicholas Lose, MD.  I have reviewed the above documentation for accuracy and completeness, and I agree with the above.

## 2019-11-03 ENCOUNTER — Inpatient Hospital Stay: Payer: Managed Care, Other (non HMO) | Attending: Genetic Counselor | Admitting: Hematology and Oncology

## 2019-11-03 ENCOUNTER — Encounter: Payer: Self-pay | Admitting: Obstetrics and Gynecology

## 2019-11-03 ENCOUNTER — Other Ambulatory Visit: Payer: Self-pay

## 2019-11-03 ENCOUNTER — Ambulatory Visit (INDEPENDENT_AMBULATORY_CARE_PROVIDER_SITE_OTHER): Payer: Managed Care, Other (non HMO) | Admitting: Obstetrics and Gynecology

## 2019-11-03 VITALS — BP 120/72 | HR 84 | Temp 97.2°F | Wt 175.2 lb

## 2019-11-03 DIAGNOSIS — Z1231 Encounter for screening mammogram for malignant neoplasm of breast: Secondary | ICD-10-CM | POA: Diagnosis not present

## 2019-11-03 DIAGNOSIS — Z1501 Genetic susceptibility to malignant neoplasm of breast: Secondary | ICD-10-CM

## 2019-11-03 DIAGNOSIS — Z90722 Acquired absence of ovaries, bilateral: Secondary | ICD-10-CM | POA: Diagnosis not present

## 2019-11-03 DIAGNOSIS — Z1509 Genetic susceptibility to other malignant neoplasm: Secondary | ICD-10-CM | POA: Insufficient documentation

## 2019-11-03 DIAGNOSIS — Z90710 Acquired absence of both cervix and uterus: Secondary | ICD-10-CM

## 2019-11-03 NOTE — Assessment & Plan Note (Signed)
BRCA2 gene mutation: I discussed with the patient that BRCA2 is a tumor suppressor gene which helps repair damaged DNA or destroy cells if they cannot be repaired. In patients with BRCA1 or 2 mutations, the damaged DNA could not be repaired properly increasing the risk of cancers. BRCA2 gene is located on long arm of chromosome 13. These mutations are inherited in autosomal dominant fashion and hence 50% probability that their children may have a BRCA mutation.  Cancer risk:  Average to risk of cancer by age of 71: (Antoniou et al pooled pedigree data from 38 studies of 8139 index patients with breast or ovarian cancer) Breast cancer risk: 45 percent (95% CI, 33 to 54 percent) Ovarian cancer risk: 11 percent (95% CI, 4.1 to 18 percent)  [place] study: Tumor limited to risk by age of 8: Breast cancer risk: 43 percent (95% CI, 41 to 70 percent) Ovarian cancer risk: 16.5 percent (95% CI, 7.5 to 34 percent)  Other cancer risks:  1. Pancreatic cancer (relative risk is 3.51) with incidence of 4.9% in BRCA2 carriers 2. Colorectal cancers: In many studies there was no increased risk But there may be some for BRCA1 carriers 5. Melanoma and other skin cancers: The risk of oatmeal melanoma is increase in BRCA2 mutation carriers but it still extremely rare.   Breast cancer risk reduction/surveillance: 1. Annual mammogram and breast MRI are recommended by [facility] starting at age of 67-30 2. Chemoprevention with tamoxifen-like agents is not entirely clear.   Ovarian cancer risk reduction: Patient underwent risk reducing bilateral salpingo-oophorectomy and hysterectomy November 2020.

## 2019-11-04 ENCOUNTER — Telehealth: Payer: Self-pay | Admitting: Hematology and Oncology

## 2019-11-04 NOTE — Telephone Encounter (Signed)
I talk with patient regarding schedule  

## 2019-11-23 ENCOUNTER — Other Ambulatory Visit: Payer: Self-pay

## 2019-11-24 ENCOUNTER — Ambulatory Visit (INDEPENDENT_AMBULATORY_CARE_PROVIDER_SITE_OTHER): Payer: Managed Care, Other (non HMO) | Admitting: Obstetrics and Gynecology

## 2019-11-24 ENCOUNTER — Encounter: Payer: Self-pay | Admitting: Obstetrics and Gynecology

## 2019-11-24 VITALS — BP 138/88 | HR 88 | Temp 97.1°F | Wt 180.2 lb

## 2019-11-24 DIAGNOSIS — Z1501 Genetic susceptibility to malignant neoplasm of breast: Secondary | ICD-10-CM

## 2019-11-24 DIAGNOSIS — Z9079 Acquired absence of other genital organ(s): Secondary | ICD-10-CM

## 2019-11-24 DIAGNOSIS — Z90710 Acquired absence of both cervix and uterus: Secondary | ICD-10-CM

## 2019-11-24 DIAGNOSIS — Z90722 Acquired absence of ovaries, bilateral: Secondary | ICD-10-CM

## 2019-11-24 DIAGNOSIS — Z1509 Genetic susceptibility to other malignant neoplasm: Secondary | ICD-10-CM

## 2019-11-24 DIAGNOSIS — Z8041 Family history of malignant neoplasm of ovary: Secondary | ICD-10-CM

## 2019-11-24 NOTE — Progress Notes (Signed)
GYNECOLOGY  VISIT   HPI: 48 y.o.   Married White or Caucasian Not Hispanic or Latino  female   G0P0 with Patient's last menstrual period was 09/24/2019 (exact date).   here for 4 week post op s/p TLH/BSO for BRCA 2 gene mutation. Benign pathology.  She was started on an estradiol patch for short term control of her vasomotor symptoms. No change in her long term night sweats, no hot flashes. She wants to try and get off of the patch.  Some slight lower back discomfort, thinks because she hasn't been as active. Occasional abdominal twinge, no baseline discomfort. Normal bowel and bladder function.    GYNECOLOGIC HISTORY: Patient's last menstrual period was 09/24/2019 (exact date). Contraception TLH Menopausal hormone therapy: Estradiol patch        OB History    Gravida  0   Para      Term      Preterm      AB      Living        SAB      TAB      Ectopic      Multiple      Live Births                 Patient Active Problem List   Diagnosis Date Noted  . Status post laparoscopic hysterectomy 10/27/2019  . Genetic testing 10/08/2019  . BRCA2 gene mutation positive 10/08/2019  . Family history of breast cancer   . Family history of ovarian cancer   . Family history of BRCA2 gene positive   . 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 Restless leg syndrome 02/13/2018  . Chronic use of benzodiazepine for therapeutic purpose 02/13/2018  . Elevated blood pressure reading 02/13/2018  . Primary insomnia 07/10/2013    Past Medical History:  Diagnosis Date  . Abnormal Pap smear of cervix    8/16 ASCUS HPV HR +  . Anxiety   . Asthma    laughing, cold drink, exercise  . Cold induced bronchospasm   . Family history of BRCA2 gene positive   . Family history of breast cancer   . Family history of ovarian cancer   . Insomnia   . PONV (postoperative nausea and vomiting)    Positional nausea  . Restless legs syndrome (RLS)     Past Surgical History:  Procedure  Laterality Date  . ABDOMINAL HYSTERECTOMY    . CYSTOSCOPY N/A 10/27/2019   Procedure: CYSTOSCOPY;  Surgeon: Salvadore Dom, MD;  Location: [facility];  Service: Gynecology;  Laterality: N/A;  . TOTAL LAPAROSCOPIC HYSTERECTOMY WITH BILATERAL SALPINGO OOPHORECTOMY Bilateral 10/27/2019   Procedure: TOTAL LAPAROSCOPIC HYSTERECTOMY WITH BILATERAL SALPINGO OOPHORECTOMY;  Surgeon: Salvadore Dom, MD;  Location: [facility];  Service: Gynecology;  Laterality: Bilateral;  Extended recovery bed needed  . WISDOM TOOTH EXTRACTION      Current Outpatient Medications  Medication Sig Dispense Refill  . albuterol (PROVENTIL HFA;VENTOLIN HFA) 108 (90 Base) MCG/ACT inhaler Inhale 2 puffs, 15 minutes before physical activity 1 Inhaler 2  . clonazePAM (KLONOPIN) 0.5 MG tablet Take 0.5-1 mg by mouth at bedtime.     Marland Kitchen estradiol (VIVELLE-DOT) 0.1 MG/24HR patch Place 1 patch (0.1 mg total) onto the skin 2 (two) times a week. 24 patch 1   No current facility-administered medications for this visit.      ALLERGIES: Patient has no known allergies.  Family History  Problem Relation Age of Onset  .  Breast cancer Maternal Grandmother        dx younger than 49  . Cancer Maternal Grandfather        lung cancer  . Asthma Father   . Asthma Paternal Grandfather   . Asthma Maternal Aunt   . Ovarian cancer Mother        stage 38  . Cancer Mother        stomach wall  . BRCA 1/2 Mother        BRCA2 +  . Cerebral palsy Sister   . COPD Paternal 63   . Asthma Paternal Aunt   . Breast cancer Other        d. 53  . Asthma Paternal Aunt     Social History   Socioeconomic History  . Marital status: Married    Spouse name: Not on file  . Number of children: Not on file  . Years of education: Not on file  . Highest education level: Not on file  Occupational History  . Not on file  Social Needs  . Financial resource strain: Not on file  . Food insecurity    Worry: Not  on file    Inability: Not on file  . Transportation needs    Medical: Not on file    Non-medical: Not on file  Tobacco Use  . Smoking status: Never Smoker  . Smokeless tobacco: Never Used  Substance and Sexual Activity  . Alcohol use: No  . Drug use: No  . Sexual activity: Not Currently    Partners: Male    Birth control/protection: Surgical  Lifestyle  . Physical activity    Days per week: Not on file    Minutes per session: Not on file  . Stress: Not on file  Relationships  . Social Herbalist on phone: Not on file    Gets together: Not on file    Attends religious service: Not on file    Active member of club or organization: Not on file    Attends meetings of clubs or organizations: Not on file    Relationship status: Not on file  . Intimate partner violence    Fear of current or ex partner: Not on file    Emotionally abused: Not on file    Physically abused: Not on file    Forced sexual activity: Not on file  Other Topics Concern  . Not on file  Social History Narrative  . Not on file    Review of Systems  Constitutional: Negative.   HENT: Negative.   Eyes: Negative.   Respiratory: Negative.   Cardiovascular: Negative.   Gastrointestinal: Negative.   Genitourinary: Negative.   Musculoskeletal: Negative.   Skin: Negative.   Neurological: Negative.   Endo/Heme/Allergies: Negative.   Psychiatric/Behavioral: Negative.     PHYSICAL EXAMINATION:    BP 138/88 (BP Location: Right Arm, Patient Position: Sitting, Cuff Size: Normal)   Pulse 88   Temp (!) 97.1 F (36.2 C) (Skin)   Wt 180 lb 3.2 oz (81.7 kg)   LMP 09/24/2019 (Exact Date)   BMI 27.40 kg/m     General appearance: alert, cooperative and appears stated age Abdomen: soft, non-tender; non distended, no masses,  no organomegaly Incisions: well healed  Pelvic: External genitalia:  no lesions              Urethra:  normal appearing urethra with no masses, tenderness or lesions  Bartholins and Skenes: normal                 Vagina: normal appearing vagina with normal color and discharge, no lesions. Vaginal cuff healing well, not tender.              Cervix: absent              Bimanual Exam:  Uterus:  uterus absent              Adnexa: no mass, fullness, tenderness                Chaperone was present for exam.  ASSESSMENT 4 weeks post op s/p TLH/BSO On estrogen patch, no significant vasomotor symptoms. She wants to go off    PLAN She will try 1/2 a 0.1 mg estradiol patch. If she tolerates this, she can try going off the patch. She will call if she wants to wean more slowly Discussed vaginal atrophy, using lubrication, she will call if she is having issues. We discussed vaginal estrogen, will try and avoid if possible secondary to her risk of breast cancer.  Pelvic rest until 12 weeks post op Increase activity as tolerated We discussed that she needs pelvic exams and CA 125's every year for 10 years secondary to the risk of peritoneal cancer.    An After Visit Summary was printed and given to the patient.  Cc: Evalee Mutton, CNM

## 2019-11-27 ENCOUNTER — Other Ambulatory Visit: Payer: Self-pay | Admitting: Urology

## 2019-12-07 ENCOUNTER — Other Ambulatory Visit: Payer: Self-pay | Admitting: Urology

## 2019-12-11 ENCOUNTER — Encounter (HOSPITAL_BASED_OUTPATIENT_CLINIC_OR_DEPARTMENT_OTHER): Payer: Self-pay | Admitting: Urology

## 2019-12-11 ENCOUNTER — Other Ambulatory Visit: Payer: Self-pay

## 2019-12-11 NOTE — Progress Notes (Signed)
Spoke w/ via phone for pre-op interview---[facility] needs dos----   I stat 8           Lab results------ COVID test ------12-12-19 Arrive at -------1145 am 12-16-19  NPO after ------midnight food, clear liquids until 730 am then npo Medications to take morning of surgery -----albuterol inhaler prn/bring inhaler Diabetic medication -----n/a Patient Special Instructions ----- Pre-Op special Istructions ----- Patient verbalized understanding of instructions that were given at this phone interview. Patient denies shortness of breath, chest pain, fever, cough a this phone interview.

## 2019-12-12 ENCOUNTER — Other Ambulatory Visit (HOSPITAL_COMMUNITY)
Admission: RE | Admit: 2019-12-12 | Discharge: 2019-12-12 | Disposition: A | Discharge status: Home / Self Care | Payer: Managed Care, Other (non HMO) | Source: Ambulatory Visit | Attending: Urology | Admitting: Urology

## 2019-12-12 DIAGNOSIS — Z20828 Contact with and (suspected) exposure to other viral communicable diseases: Secondary | ICD-10-CM | POA: Insufficient documentation

## 2019-12-12 DIAGNOSIS — Z01812 Encounter for preprocedural laboratory examination: Secondary | ICD-10-CM | POA: Diagnosis present

## 2019-12-13 LAB — NOVEL CORONAVIRUS, NAA (HOSP ORDER, SEND-OUT TO REF LAB; TAT 18-24 HRS): SARS-CoV-2, NAA: NOT DETECTED

## 2019-12-16 ENCOUNTER — Other Ambulatory Visit: Payer: Self-pay

## 2019-12-16 ENCOUNTER — Ambulatory Visit (HOSPITAL_BASED_OUTPATIENT_CLINIC_OR_DEPARTMENT_OTHER): Payer: Managed Care, Other (non HMO) | Admitting: Anesthesiology

## 2019-12-16 ENCOUNTER — Encounter (HOSPITAL_BASED_OUTPATIENT_CLINIC_OR_DEPARTMENT_OTHER): Payer: Self-pay | Admitting: Urology

## 2019-12-16 ENCOUNTER — Ambulatory Visit (HOSPITAL_BASED_OUTPATIENT_CLINIC_OR_DEPARTMENT_OTHER)
Admission: RE | Admit: 2019-12-16 | Discharge: 2019-12-16 | Disposition: A | Discharge status: Home / Self Care | Payer: Managed Care, Other (non HMO) | Attending: Urology | Admitting: Urology

## 2019-12-16 ENCOUNTER — Encounter (HOSPITAL_BASED_OUTPATIENT_CLINIC_OR_DEPARTMENT_OTHER)
Admission: RE | Disposition: A | Discharge status: Home / Self Care | Payer: Self-pay | Source: Home / Self Care | Attending: Urology

## 2019-12-16 DIAGNOSIS — J45909 Unspecified asthma, uncomplicated: Secondary | ICD-10-CM | POA: Insufficient documentation

## 2019-12-16 DIAGNOSIS — N3030 Trigonitis without hematuria: Secondary | ICD-10-CM | POA: Diagnosis not present

## 2019-12-16 DIAGNOSIS — N329 Bladder disorder, unspecified: Secondary | ICD-10-CM | POA: Diagnosis present

## 2019-12-16 HISTORY — PX: CYSTOSCOPY WITH BIOPSY: SHX5122

## 2019-12-16 HISTORY — PX: CYSTOSCOPY W/ RETROGRADES: SHX1426

## 2019-12-16 LAB — POCT I-STAT, CHEM 8
BUN: 29 mg/dL — ABNORMAL HIGH (ref 6–20)
Calcium, Ion: 1.27 mmol/L (ref 1.15–1.40)
Chloride: 108 mmol/L (ref 98–111)
Creatinine, Ser: 0.8 mg/dL (ref 0.44–1.00)
Glucose, Bld: 87 mg/dL (ref 70–99)
HCT: 41 % (ref 36.0–46.0)
Hemoglobin: 13.9 g/dL (ref 12.0–15.0)
Potassium: 3.4 mmol/L — ABNORMAL LOW (ref 3.5–5.1)
Sodium: 144 mmol/L (ref 135–145)
TCO2: 25 mmol/L (ref 22–32)

## 2019-12-16 SURGERY — CYSTOSCOPY, WITH BIOPSY
Anesthesia: General | Site: Bladder

## 2019-12-16 MED ORDER — FENTANYL CITRATE (PF) 100 MCG/2ML IJ SOLN
INTRAMUSCULAR | Status: DC | PRN
  Administered 2019-12-16: 25 ug via INTRAVENOUS

## 2019-12-16 MED ORDER — OXYCODONE HCL 5 MG/5ML PO SOLN
5.0000 mg | Freq: Once | ORAL | Status: DC | PRN
  Filled 2019-12-16: qty 5

## 2019-12-16 MED ORDER — OXYCODONE HCL 5 MG PO TABS
5.0000 mg | ORAL_TABLET | Freq: Once | ORAL | Status: DC | PRN
  Filled 2019-12-16: qty 1

## 2019-12-16 MED ORDER — ONDANSETRON HCL 4 MG/2ML IJ SOLN
INTRAMUSCULAR | Status: AC
  Filled 2019-12-16: qty 2

## 2019-12-16 MED ORDER — SCOPOLAMINE 1 MG/3DAYS TD PT72
1.0000 | MEDICATED_PATCH | TRANSDERMAL | Status: DC
  Administered 2019-12-16: 1.5 mg via TRANSDERMAL
  Filled 2019-12-16: qty 1

## 2019-12-16 MED ORDER — ONDANSETRON HCL 4 MG/2ML IJ SOLN
4.0000 mg | Freq: Once | INTRAMUSCULAR | Status: DC | PRN
  Filled 2019-12-16: qty 2

## 2019-12-16 MED ORDER — MIDAZOLAM HCL 5 MG/5ML IJ SOLN
INTRAMUSCULAR | Status: DC | PRN
  Administered 2019-12-16: 2 mg via INTRAVENOUS

## 2019-12-16 MED ORDER — PROPOFOL 10 MG/ML IV BOLUS
INTRAVENOUS | Status: DC | PRN
  Administered 2019-12-16: 200 mg via INTRAVENOUS

## 2019-12-16 MED ORDER — PROPOFOL 10 MG/ML IV BOLUS
INTRAVENOUS | Status: AC
  Filled 2019-12-16: qty 20

## 2019-12-16 MED ORDER — SCOPOLAMINE 1 MG/3DAYS TD PT72
1.0000 | MEDICATED_PATCH | TRANSDERMAL | Status: DC
  Filled 2019-12-16: qty 1

## 2019-12-16 MED ORDER — LIDOCAINE 2% (20 MG/ML) 5 ML SYRINGE
INTRAMUSCULAR | Status: AC
  Filled 2019-12-16: qty 5

## 2019-12-16 MED ORDER — IOHEXOL 300 MG/ML  SOLN
INTRAMUSCULAR | Status: DC | PRN
  Administered 2019-12-16: 10 mL

## 2019-12-16 MED ORDER — FENTANYL CITRATE (PF) 100 MCG/2ML IJ SOLN
25.0000 ug | INTRAMUSCULAR | Status: DC | PRN
  Filled 2019-12-16: qty 1

## 2019-12-16 MED ORDER — ONDANSETRON HCL 4 MG/2ML IJ SOLN
INTRAMUSCULAR | Status: DC | PRN
  Administered 2019-12-16: 4 mg via INTRAVENOUS

## 2019-12-16 MED ORDER — MIDAZOLAM HCL 2 MG/2ML IJ SOLN
INTRAMUSCULAR | Status: AC
  Filled 2019-12-16: qty 2

## 2019-12-16 MED ORDER — DEXAMETHASONE SODIUM PHOSPHATE 10 MG/ML IJ SOLN
INTRAMUSCULAR | Status: DC | PRN
  Administered 2019-12-16: 10 mg via INTRAVENOUS

## 2019-12-16 MED ORDER — DEXAMETHASONE SODIUM PHOSPHATE 10 MG/ML IJ SOLN
INTRAMUSCULAR | Status: AC
  Filled 2019-12-16: qty 1

## 2019-12-16 MED ORDER — GENTAMICIN SULFATE 40 MG/ML IJ SOLN
5.0000 mg/kg | INTRAVENOUS | Status: AC
  Administered 2019-12-16: 410 mg via INTRAVENOUS
  Filled 2019-12-16 (×2): qty 10.25

## 2019-12-16 MED ORDER — LIDOCAINE 2% (20 MG/ML) 5 ML SYRINGE
INTRAMUSCULAR | Status: DC | PRN
  Administered 2019-12-16: 100 mg via INTRAVENOUS

## 2019-12-16 MED ORDER — STERILE WATER FOR IRRIGATION IR SOLN
Status: DC | PRN
  Administered 2019-12-16: 1000 mL

## 2019-12-16 MED ORDER — TRAMADOL HCL 50 MG PO TABS: 50.0000 mg | ORAL_TABLET | Freq: Four times a day (QID) | ORAL | 0 refills | Status: DC | PRN

## 2019-12-16 MED ORDER — LACTATED RINGERS IV SOLN
INTRAVENOUS | Status: DC
  Filled 2019-12-16: qty 1000

## 2019-12-16 MED ORDER — FENTANYL CITRATE (PF) 100 MCG/2ML IJ SOLN
INTRAMUSCULAR | Status: AC
  Filled 2019-12-16: qty 2

## 2019-12-16 MED ORDER — MEPERIDINE HCL 25 MG/ML IJ SOLN
6.2500 mg | INTRAMUSCULAR | Status: DC | PRN
  Filled 2019-12-16: qty 1

## 2019-12-16 MED ORDER — SCOPOLAMINE 1 MG/3DAYS TD PT72
MEDICATED_PATCH | TRANSDERMAL | Status: AC
  Filled 2019-12-16: qty 1

## 2019-12-16 SURGICAL SUPPLY — 23 items
BAG DRAIN URO-CYSTO SKYTR STRL (DRAIN) ×3 IMPLANT
CATH INTERMIT  6FR 70CM (CATHETERS) ×3 IMPLANT
CATH ROBINSON RED A/P 14FR (CATHETERS) IMPLANT
CLOTH BEACON ORANGE TIMEOUT ST (SAFETY) ×3 IMPLANT
ELECT REM PT RETURN 9FT ADLT (ELECTROSURGICAL) ×3
ELECTRODE REM PT RTRN 9FT ADLT (ELECTROSURGICAL) ×2 IMPLANT
GLOVE BIO SURGEON STRL SZ7.5 (GLOVE) ×3 IMPLANT
GOWN STRL REUS W/ TWL LRG LVL3 (GOWN DISPOSABLE) ×6 IMPLANT
GOWN STRL REUS W/TWL LRG LVL3 (GOWN DISPOSABLE) ×6 IMPLANT
GUIDEWIRE ANG ZIPWIRE 038X150 (WIRE) IMPLANT
GUIDEWIRE STR DUAL SENSOR (WIRE) IMPLANT
IV NS 1000ML (IV SOLUTION) ×1
IV NS 1000ML BAXH (IV SOLUTION) ×2 IMPLANT
IV NS IRRIG 3000ML ARTHROMATIC (IV SOLUTION) ×3 IMPLANT
KIT TURNOVER CYSTO (KITS) ×3 IMPLANT
MANIFOLD NEPTUNE II (INSTRUMENTS) ×3 IMPLANT
NEEDLE HYPO 18GX1.5 BLUNT FILL (NEEDLE) IMPLANT
NS IRRIG 500ML POUR BTL (IV SOLUTION) ×3 IMPLANT
PACK CYSTO (CUSTOM PROCEDURE TRAY) ×3 IMPLANT
SYR 10ML LL (SYRINGE) IMPLANT
SYR 20ML LL LF (SYRINGE) IMPLANT
TUBE CONNECTING 12X1/4 (SUCTIONS) IMPLANT
WATER STERILE IRR 3000ML UROMA (IV SOLUTION) ×3 IMPLANT

## 2019-12-16 NOTE — Anesthesia Preprocedure Evaluation (Addendum)
Anesthesia Evaluation  Patient identified by MRN, date of birth, ID band Patient awake    Reviewed: Allergy & Precautions, NPO status , Patient's Chart, lab work & pertinent test results  History of Anesthesia Complications (+) PONV and history of anesthetic complications  Airway Mallampati: II  TM Distance: >3 FB Neck ROM: Full    Dental no notable dental hx. (+) Teeth Intact   Pulmonary asthma ,    Pulmonary exam normal breath sounds clear to auscultation       Cardiovascular negative cardio ROS Normal cardiovascular exam Rhythm:Regular Rate:Normal     Neuro/Psych Restless legs syndrome  Neuromuscular disease negative psych ROS   GI/Hepatic negative GI ROS, Neg liver ROS,   Endo/Other  negative endocrine ROS  Renal/GU Microscopic hematuria   Microscopic hematuria R/O bladder lesion    Musculoskeletal negative musculoskeletal ROS (+)   Abdominal   Peds  Hematology negative hematology ROS (+)   Anesthesia Other Findings   Reproductive/Obstetrics negative OB ROS                            Anesthesia Physical Anesthesia Plan  ASA: II  Anesthesia Plan: General   Post-op Pain Management:    Induction: Intravenous  PONV Risk Score and Plan: 4 or greater and Ondansetron, Dexamethasone, Treatment may vary due to age or medical condition and Midazolam  Airway Management Planned: LMA  Additional Equipment:   Intra-op Plan:   Post-operative Plan: Extubation in OR  Informed Consent: I have reviewed the patients History and Physical, chart, labs and discussed the procedure including the risks, benefits and alternatives for the proposed anesthesia with the patient or authorized representative who has indicated his/her understanding and acceptance.     Dental advisory given  Plan Discussed with: CRNA and Surgeon  Anesthesia Plan Comments:         Anesthesia Quick  Evaluation

## 2019-12-16 NOTE — Anesthesia Postprocedure Evaluation (Signed)
Anesthesia Post Note  Patient: Jasmin Duran  Procedure(s) Performed: CYSTOSCOPY WITH BIOPSY (N/A Bladder) CYSTOSCOPY WITH RETROGRADE PYELOGRAM (Bilateral Bladder)     Patient location during evaluation: PACU Anesthesia Type: General Level of consciousness: awake and alert Pain management: pain level controlled Vital Signs Assessment: post-procedure vital signs reviewed and stable Respiratory status: spontaneous breathing, nonlabored ventilation, respiratory function stable and patient connected to nasal cannula oxygen Cardiovascular status: blood pressure returned to baseline and stable Postop Assessment: no apparent nausea or vomiting Anesthetic complications: no    Last Vitals:  Vitals:   12/16/19 1008 12/16/19 1207  BP: (!) 156/102 (!) 144/100  Pulse: 91 92  Resp: 16 16  Temp: 36.7 C (!) 36.3 C  SpO2: 98% 100%    Last Pain:  Vitals:   12/16/19 1207  TempSrc:   PainSc: 0-No pain                 Alayshia Marini COKER

## 2019-12-16 NOTE — Brief Op Note (Signed)
12/16/2019  11:55 AM  PATIENT:  Jasmin Duran  48 y.o. 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  PROCEDURE:  Procedure(s) with comments: CYSTOSCOPY WITH BIOPSY (N/A) - 45 MINS CYSTOSCOPY WITH RETROGRADE PYELOGRAM (Bilateral)  SURGEON:  Surgeon(s) and Role:    * Alexis Frock, MD - Primary  PHYSICIAN ASSISTANT:   ASSISTANTS: none   ANESTHESIA:   general  EBL:  minimal   BLOOD ADMINISTERED:none  DRAINS: none   LOCAL MEDICATIONS USED:  NONE  SPECIMEN:  Source of Specimen:  area of small papillary tissue left trigone  DISPOSITION OF SPECIMEN:  PATHOLOGY  COUNTS:  YES  TOURNIQUET:  * No tourniquets in log *  DICTATION: .Other Dictation: Dictation Number [id number]  PLAN OF CARE: Discharge to home after PACU  PATIENT DISPOSITION:  PACU - hemodynamically stable.   Delay start of Pharmacological VTE agent (>24hrs) due to surgical blood loss or risk of bleeding: yes

## 2019-12-16 NOTE — Transfer of Care (Signed)
Immediate Anesthesia Transfer of Care Note  Patient: Jasmin Duran  Procedure(s) Performed: CYSTOSCOPY WITH BIOPSY (N/A Bladder) CYSTOSCOPY WITH RETROGRADE PYELOGRAM (Bilateral Bladder)  Patient Location: PACU  Anesthesia Type:General  Level of Consciousness: awake, alert  and oriented  Airway & Oxygen Therapy: Patient Spontanous Breathing and Patient connected to nasal cannula oxygen  Post-op Assessment: Report given to RN  Post vital signs: Reviewed and stable  Last Vitals:  Vitals Value Taken Time  BP 144/100 12/16/19 1203  Temp    Pulse 96 12/16/19 1204  Resp 22 12/16/19 1204  SpO2 100 % 12/16/19 1204  Vitals shown include unvalidated device data.  Last Pain:  Vitals:   12/16/19 1008  TempSrc: Skin  PainSc: 0-No pain      Patients Stated Pain Goal: 7 (85/46/27 0350)  Complications: No apparent anesthesia complications

## 2019-12-16 NOTE — Discharge Instructions (Signed)
1 - You may have urinary urgency (bladder spasms) and bloody urine on / off x few days. This is normal.  2 - Call MD or go to ER for fever >102, severe pain / nausea / vomiting not relieved by medications, or acute change in medical status  Bladder Biopsy, Care After Refer to this sheet in the next few weeks. These instructions provide you with information about caring for yourself after your procedure. Your health care provider may also give you more specific instructions. Your treatment has been planned according to current medical practices, but problems sometimes occur. Call your health care provider if you have any problems or questions after your procedure. What can I expect after the procedure? After the procedure, it is common to have:  Mild pain in your bladder or kidney area during urination.  Minor burning during urination.  Small amounts of blood in your urine.  A sudden urge to urinate.  A need to urinate more often than usual. Follow these instructions at home: Medicines  Take over-the-counter and prescription medicines only as told by your health care provider.  If you were prescribed an antibiotic medicine, take it as told by your health care provider. Do not stop taking the antibiotic even if you start to feel better. General instructions   Take a warm bath to relieve any burning sensations around your urethra.  Hold a warm, damp washcloth over the urethral area to ease pain.  Return to your normal activities as told by your health care provider. Ask your health care provider what activities are safe for you.  Do not drive for 24 hours if you received a medicine to help you relax (sedative) during your procedure. Ask your health care provider when it is safe for you to drive.  It is your responsibility to get the results of your procedure. Ask your health care provider or the department performing the procedure when your results will be ready.  Keep all follow-up  visits as told by your health care provider. This is important. Contact a health care provider if:  You have a fever.  Your symptoms do not improve within 24 hours and you continue to have: ? Burning during urination. ? Increasing amounts of blood in your urine. ? Pain during urination. ? An urgent need to urinate. ? A need to urinate more often than usual. Get help right away if:  You have a lot of bleeding or more bleeding.  You have severe pain.  You are unable to urinate.  You have bright red blood in your urine.  You are passing blood clots in your urine.  You have a fever.  You have swelling, redness, or pain in your legs.  You have difficulty breathing. This information is not intended to replace advice given to you by your health care provider. Make sure you discuss any questions you have with your health care provider. Document Released: 12/27/2015 Document Revised: 05/17/2016 Document Reviewed: 12/27/2015 Elsevier Patient Education  [place] Instructions  Activity: Get plenty of rest for the remainder of the day. A responsible individual must stay with you for 24 hours following the procedure.  For the next 24 hours, DO NOT: -Drive a car -Paediatric nurse -Drink alcoholic beverages -Take any medication unless instructed by your physician -Make any legal decisions or sign important papers.  Meals: Start with liquid foods such as gelatin or soup. Progress to regular foods as tolerated. Avoid greasy, spicy,  heavy foods. If nausea and/or vomiting occur, drink only clear liquids until the nausea and/or vomiting subsides. Call your physician if vomiting continues.  Special Instructions/Symptoms: Your throat may feel dry or sore from the anesthesia or the breathing tube placed in your throat during surgery. If this causes discomfort, gargle with warm salt water. The discomfort should disappear within 24 hours.  If you had a  scopolamine patch placed behind your ear for the management of post- operative nausea and/or vomiting:  1. The medication in the patch is effective for 72 hours, after which it should be removed.  Wrap patch in a tissue and discard in the trash. Wash hands thoroughly with soap and water. 2. You may remove the patch earlier than 72 hours if you experience unpleasant side effects which may include dry mouth, dizziness or visual disturbances. 3. Avoid touching the patch. Wash your hands with soap and water after contact with the patch.

## 2019-12-16 NOTE — H&P (Signed)
Jasmin Duran is an 48 y.o. female.    Chief Complaint: Pre-OP Cysto, Retrogrades, Bladder Biopsy  HPI:   1 - Rule Out Bladder Cancer - small urothelial lesions (papillary changes inter-trigone) noted during routine intraop cysto during lap hyst 10/2019. Non smoker. No chornic solvent exposure. She is BRCA positive.   PMH sig for +BRCA, benign lap hyst/bso (benign, prophylactic), insomnia. Her PCP is Dr. Orlan Leavens with [facility] in [place].   Today "Jasmin Duran" is seen to proceed with bladder biopsy, retrogrades to rule out bladder neoplasm. Most recent UA without infecitous parameters. C19 screen negative.   Past Medical History:  Diagnosis Date  . Asthma    per pt triggers are laughing, cold drink, exercise  . Cold induced bronchospasm   . Family history of BRCA2 gene positive   . Family history of breast cancer   . Family history of ovarian cancer   . History of abnormal cervical Pap smear 07/2015  . Insomnia   . PONV (postoperative nausea and vomiting)    Positional nausea, did well with recent surgeries  . Restless legs syndrome (RLS)     Past Surgical History:  Procedure Laterality Date  . CYSTOSCOPY N/A 10/27/2019   Procedure: CYSTOSCOPY;  Surgeon: Salvadore Dom, MD;  Location: [facility];  Service: Gynecology;  Laterality: N/A;  . TOTAL LAPAROSCOPIC HYSTERECTOMY WITH BILATERAL SALPINGO OOPHORECTOMY Bilateral 10/27/2019   Procedure: TOTAL LAPAROSCOPIC HYSTERECTOMY WITH BILATERAL SALPINGO OOPHORECTOMY;  Surgeon: Salvadore Dom, MD;  Location: [facility];  Service: Gynecology;  Laterality: Bilateral;  Extended recovery bed needed  . WISDOM TOOTH EXTRACTION      Family History  Problem Relation Age of Onset  . Breast cancer Maternal Grandmother        dx younger than 25  . Cancer Maternal Grandfather        lung cancer  . Asthma Father   . Asthma Paternal Grandfather   . Asthma Maternal Aunt   . Ovarian cancer Mother       stage 25  . Cancer Mother        stomach wall  . BRCA 1/2 Mother        BRCA2 +  . Cerebral palsy Sister   . COPD Paternal 64   . Asthma Paternal Aunt   . Breast cancer Other        d. 48  . Asthma Paternal Aunt    Social History:  reports that she has never smoked. She has never used smokeless tobacco. She reports that she does not drink alcohol or use drugs.  Allergies: No Known Allergies  No medications prior to admission.    No results found for this or any previous visit (from the past 48 hour(s)). No results found.  Review of Systems  Constitutional: Negative for chills and fever.  Genitourinary: Negative for hematuria.  All other systems reviewed and are negative.   Height 5' 8" (1.727 m), weight 79.4 kg, last menstrual period 09/24/2019. Physical Exam  Constitutional: She appears well-developed.  HENT:  Head: Normocephalic.  Eyes: Pupils are equal, round, and reactive to light.  Cardiovascular: Normal rate.  Respiratory: Effort normal.  GI: Soft.  Genitourinary:    Genitourinary Comments: No CVAT   Musculoskeletal:        General: Normal range of motion.     Cervical back: Normal range of motion.  Neurological: She is alert.  Skin: Skin is warm.  Psychiatric: She has a normal mood and affect.  Assessment/Plan  Proceed with cysto, retrogrades, bladder biopsy. Risks, benefits, alternatives, expected peri-op course discussed previously and reiterated today.   Theodore Manny, MD 12/16/2019, 8:02 AM   

## 2019-12-16 NOTE — Op Note (Signed)
Jasmin Duran, [place] MEDICAL RECORD [id number] ACCOUNT [id number] DATE OF BIRTH:06/07/1971 FACILITY: [facility] LOCATION: [facility] PHYSICIAN:Cleda Imel Tresa Moore, MD  OPERATIVE REPORT  DATE OF PROCEDURE:  12/16/2019  SURGEON:  Alexis Frock MD  PREOPERATIVE DIAGNOSIS:  Small bladder abnormality, rule out bladder cancer.  POSTOPERATIVE DIAGNOSIS:  Small bladder abnormality, rule out bladder cancer.  PROCEDURE: 1.  Cystoscopy, bilateral pyelograms, interpretation. 2.  Bladder biopsy, fulguration.  ESTIMATED BLOOD LOSS:  Nil.  COMPLICATIONS:  None.  SPECIMENS:  Area of subtle left trigone papillary tissue for pathology.  FINDINGS: 1.  Unremarkable retrograde pyelograms. 2.  Very subtle area of papillary tissue just medial to the left ureteral orifice, favor trigonitis. 3.  Continued patency of the left ureter following biopsy and fulguration.  Photo documentation performed.  INDICATIONS:  The patient is a very pleasant 48 year old lady with history of BRCA mutation who underwent a prophylactic hysterectomy earlier this year.  At the conclusion of her surgery, she underwent routine cystoscopy, at which point her OB/GYN, Dr.  Talbert Nan, astutely noted a subtle papillary abnormality of the trigone of the bladder.  She was referred for further characterization.   After review of record and especially the patient's history of oncologic predisposition, it was clearly felt that a  biopsy would be warranted to maximally rule out bladder cancer and she wished to proceed.  Informed consent was obtained and placed in the medical record.  DESCRIPTION OF PROCEDURE:  The patient being identified,  procedure being cystoscopy,  bladder biopsy and fulguration was confirmed, timeout was performed.  General LMA anesthesia induced.  The patient was placed into a low lithotomy position, sterile  field was created by prepping and draping the patient's vagina, introitus and proximal thighs.  A  cystourethroscopy was performed using 21-French rigid cystoscope with offset lens.  Inspection of bladder revealed bilateral singleton ureteral orifices, no  large papillary lesions whatsoever.  There was a very subtle papillary abnormality of the bladder, certainly less than 1 cm.  This was approximately 7 mm medial to the left ureteral orifice.  Photo documentation performed.  Attention was directed at  retrograde pyelograms.  The left ureteral orifice was cannulated with a 6-French renal catheter and left retrograde pyelogram was obtained.  Left retrograde pyelogram demonstrated a single left ureter with single system left kidney.  No filling defects or narrowing noted.  Similarly, right pyelogram was obtained.  Right pyelogram demonstrated a single right ureter with single system right kidney.  No filling defects or narrowing noted.  Next, using cold cup biopsy forceps, excisional biopsy was performed of the area of papillary tissue.  This was set aside for  permanent pathology.  Bugbee electrode was used to fulgurate the base and edges of this.  Exquisite care was taken to avoid any injury to the ureteral orifice, which did not occur.  Photo documentation performed post-biopsy fulguration.  Bladder was  entered per cystoscope.  Procedure terminated.  The patient tolerated the procedure well.  No immediate complications.  The patient was taken to postanesthesia care in stable condition.  VN/NUANCE  D:12/16/2019 T:12/16/2019 JOB:[id number]

## 2019-12-16 NOTE — Anesthesia Procedure Notes (Signed)
Procedure Name: LMA Insertion Date/Time: 12/16/2019 11:37 AM Performed by: Bonney Aid, CRNA Pre-anesthesia Checklist: Patient identified, Emergency Drugs available, Suction available and Patient being monitored Patient Re-evaluated:Patient Re-evaluated prior to induction Oxygen Delivery Method: Circle system utilized Preoxygenation: Pre-oxygenation with 100% oxygen Induction Type: IV induction Ventilation: Mask ventilation without difficulty LMA: LMA inserted LMA Size: 4.0 Number of attempts: 1 Airway Equipment and Method: Bite block Placement Confirmation: positive ETCO2 Tube secured with: Tape Dental Injury: Teeth and Oropharynx as per pre-operative assessment

## 2019-12-17 LAB — SURGICAL PATHOLOGY

## 2020-03-11 ENCOUNTER — Ambulatory Visit
Admission: RE | Admit: 2020-03-11 | Discharge: 2020-03-11 | Disposition: A | Discharge status: Home / Self Care | Payer: Managed Care, Other (non HMO) | Source: Ambulatory Visit | Attending: Hematology and Oncology | Admitting: Hematology and Oncology

## 2020-03-11 DIAGNOSIS — Z1231 Encounter for screening mammogram for malignant neoplasm of breast: Secondary | ICD-10-CM

## 2020-03-11 IMAGING — MR MR BREAST BILAT WO/W CM
8 of 12 series · 30 of 48 positions shown · IV contrast (8 ml Gadavist)
Comparison: Previous mammograms.  No prior breast MRI.

CLINICAL DATA: High risk screening breast MRI. BRCA 2 mutation
positive. Family history breast cancer in maternal grandmother.

LABS:  None.
EXAM:
BILATERAL BREAST MRI WITH AND WITHOUT CONTRAST
TECHNIQUE: Multiplanar, multisequence MR images of both breasts were obtained
prior to and following the intravenous administration of 8 ml of
Gadavist

[Series 8: t2_tirm_tra ipat (a-p) · axial · 3.0mm · 0.70mm/px · 1 of 70 slices shown]
[im 1/70]
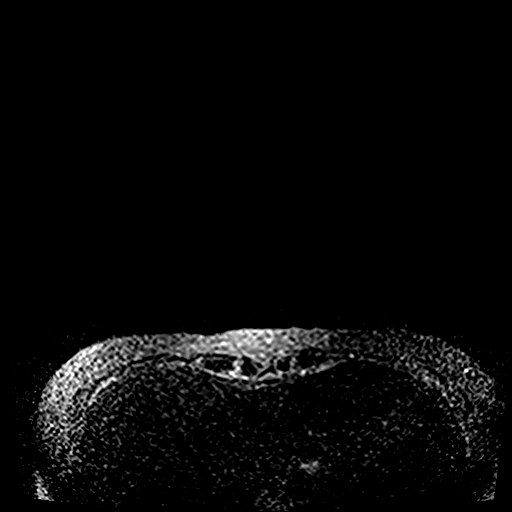

[Series 9: fl3d pre-cm no · axial · non-contrast · 0.9mm · 0.94mm/px · z∈[-126,+60]mm · 5 of 208 slices shown]
[im 1/208]
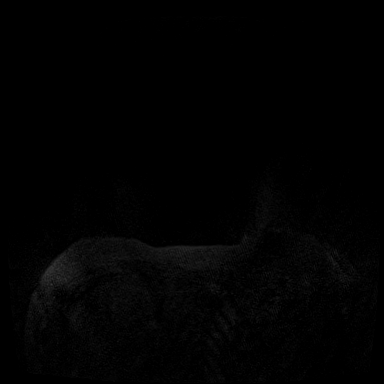
[im 52/208]
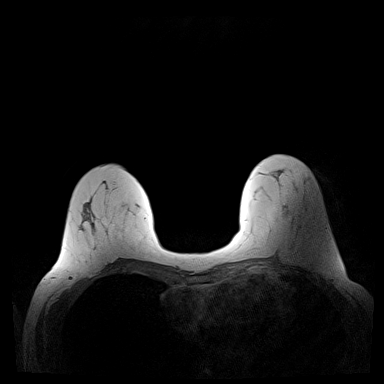
[im 104/208]
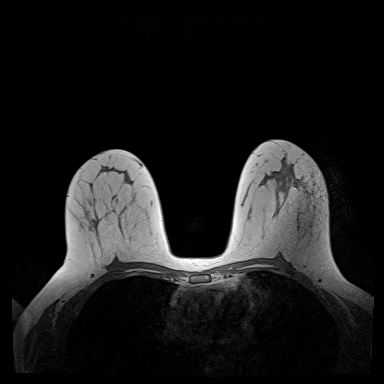
[im 156/208]
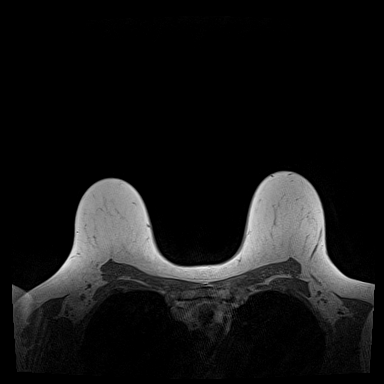
[im 208/208]
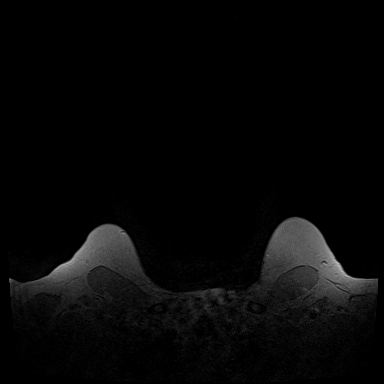

[Series 10: fl3d pre-cm · axial · non-contrast · 0.9mm · 0.87mm/px · z∈[-129,+57]mm · 5 of 208 slices shown]
[im 1/208]
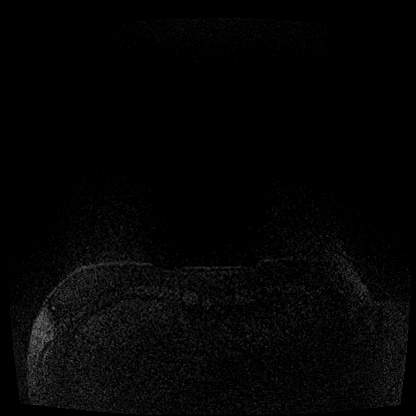
[im 52/208]
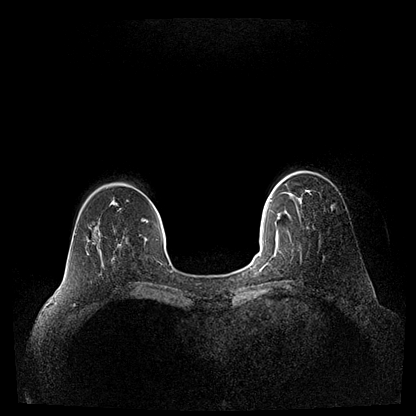
[im 104/208]
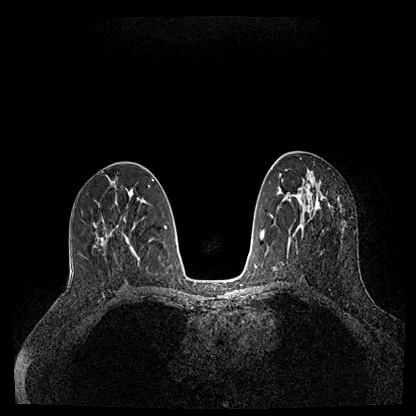
[im 156/208]
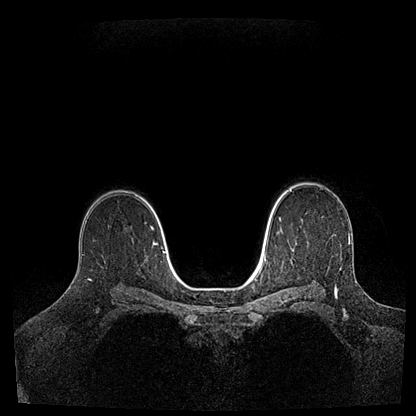
[im 208/208]
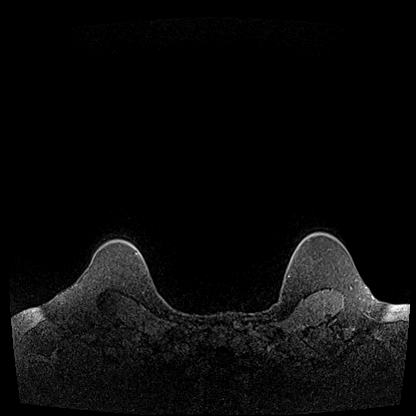

[Series 11: fl3d post-cm 20 · axial · 0.9mm · 0.87mm/px · z∈[-129,+57]mm · 5 of 208 slices shown (1 of 3)]
[im 1/208]
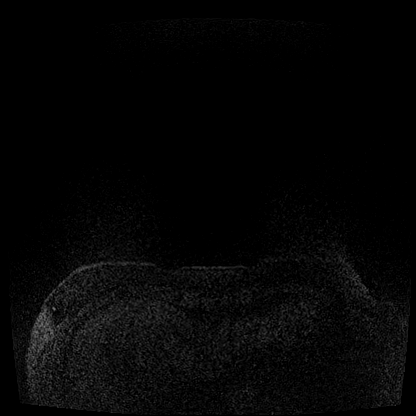
[im 52/208]
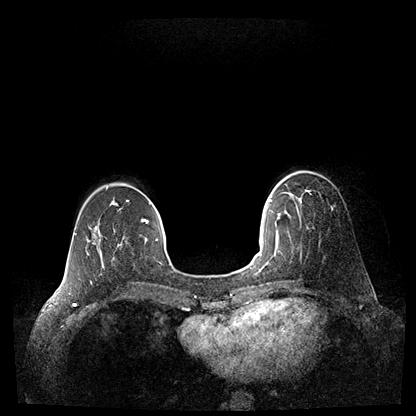
[im 104/208]
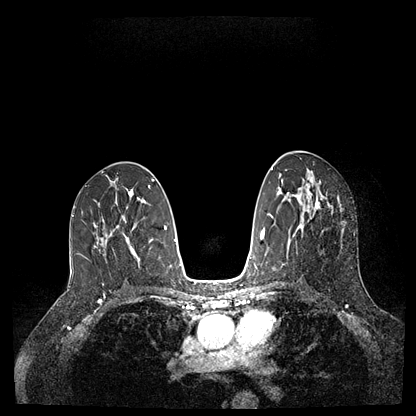
[im 156/208]
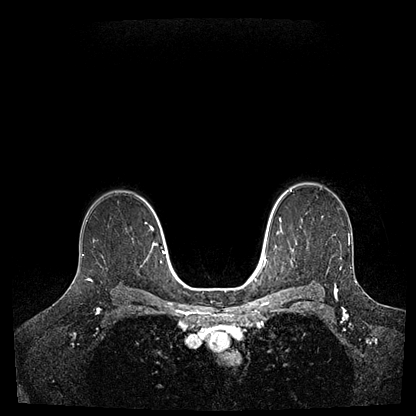
[im 208/208]
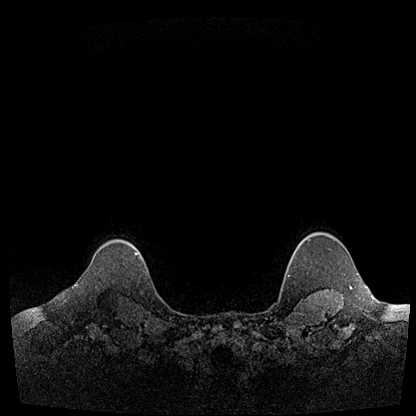

[Series 12: fl3d post-cm 20 · axial · 0.9mm · 0.87mm/px · z∈[-129,+57]mm · 5 of 208 slices shown (2 of 3)]
[im 1/208]
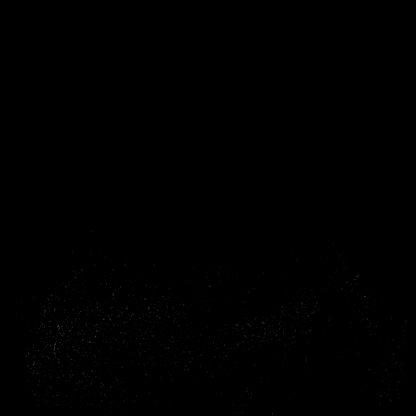
[im 52/208]
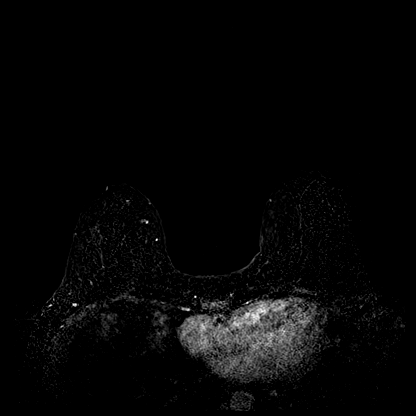
[im 104/208]
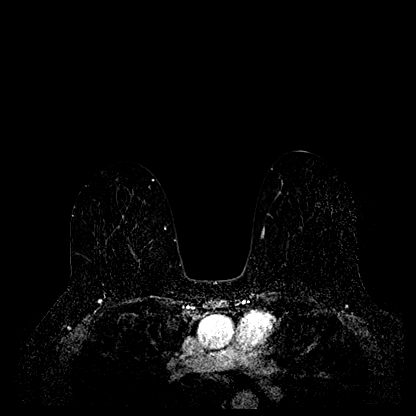
[im 156/208]
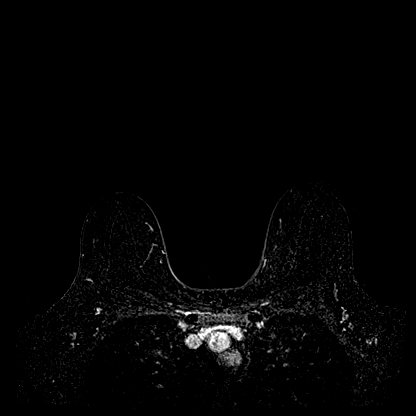
[im 208/208]
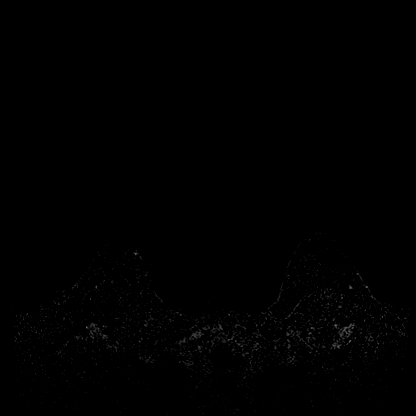

[Series 13: fl3d post-cm 20 · axial · 187.2mm · 0.87mm/px · 1 of 1 slices shown (3 of 3)]
[im 1/1]
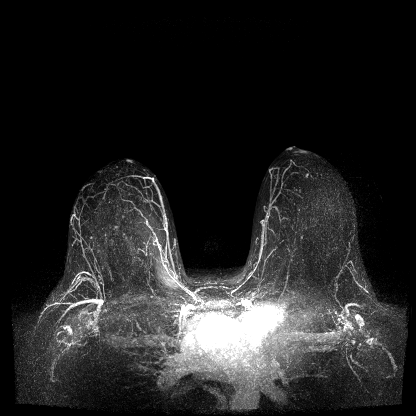

[Series 14: fl3d post-cm 3min · axial · 0.9mm · 0.87mm/px · z∈[-129,+57]mm · 6 of 208 slices shown]
[im 1/208]
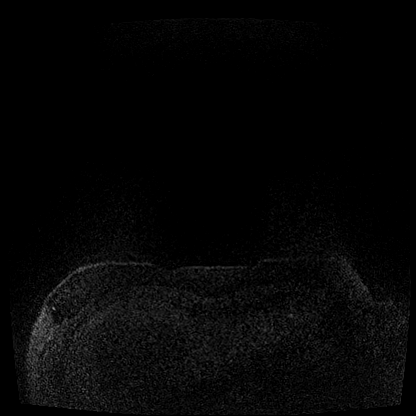
[im 42/208]
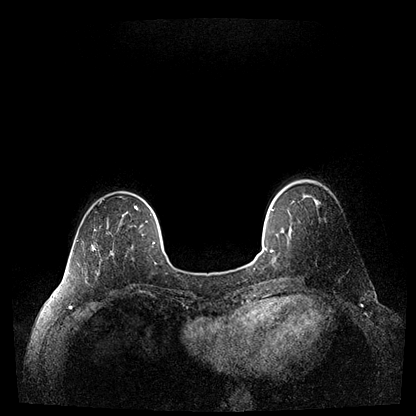
[im 83/208]
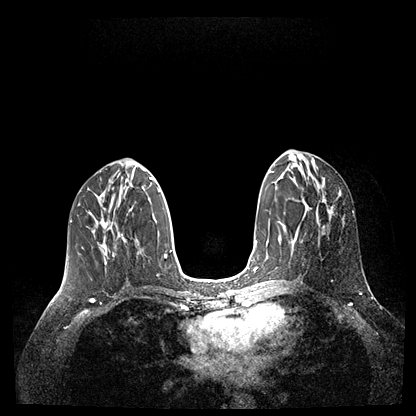
[im 125/208]
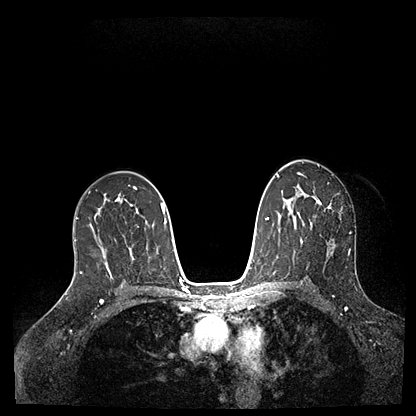
[im 166/208]
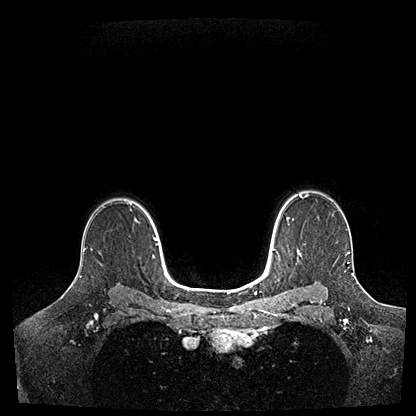
[im 208/208]
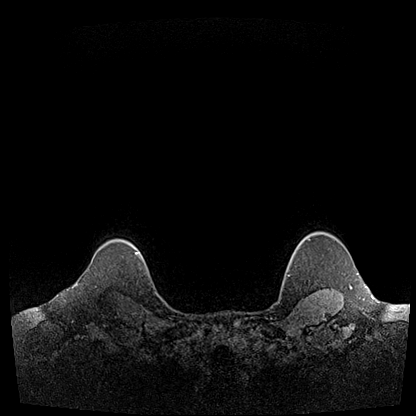

[Series 15: fl3d post-cm 3min_sub · axial · 0.9mm · 0.87mm/px · z∈[-129,-92]mm · 2 of 208 slices shown]
[im 1/208]
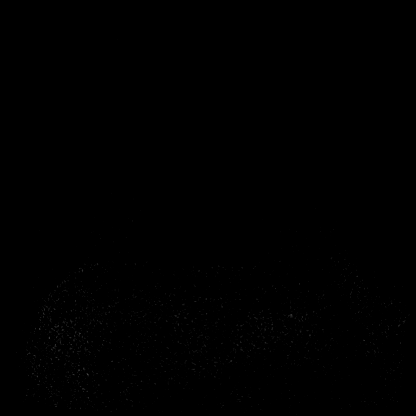
[im 42/208]
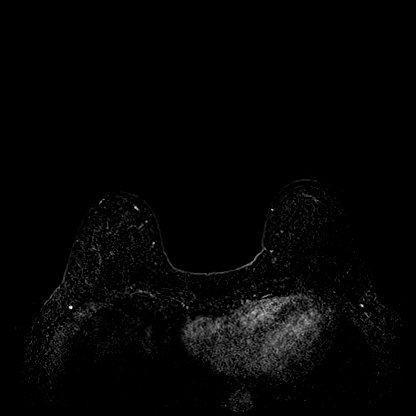

[30 of 48 positions shown; findings below may reference images not displayed]

Three-dimensional MR images were rendered by post-processing of the
original MR data on an independent workstation. The
three-dimensional MR images were interpreted, and findings are
reported in the following complete MRI report for this study. Three
dimensional images were evaluated at the independent [employer]
workstation
FINDINGS: Breast composition: b. Scattered fibroglandular tissue.

Background parenchymal enhancement: Mild

Right breast: No mass or abnormal enhancement.

Left breast: No mass or abnormal enhancement.

Lymph nodes: No abnormal appearing lymph nodes.

Ancillary findings:  None.
IMPRESSION: Unremarkable breast MRI without evidence of malignancy.

RECOMMENDATION:
Recommend continued annual bilateral 3D screening mammography.

The American Cancer Society recommends annual MRI and mammography in
patients with an estimated lifetime risk of developing breast cancer
greater than 20 - 25%, or who are known or suspected to be positive
for the breast cancer gene.

BI-RADS CATEGORY  1: Negative.

## 2020-03-11 MED ORDER — GADOBUTROL 1 MMOL/ML IV SOLN
8.0000 mL | Freq: Once | INTRAVENOUS | Status: AC | PRN
  Administered 2020-03-11: 11:00:00 8 mL via INTRAVENOUS

## 2020-03-14 ENCOUNTER — Encounter: Payer: Self-pay | Admitting: Certified Nurse Midwife

## 2020-03-28 ENCOUNTER — Encounter: Payer: Self-pay | Admitting: Hematology and Oncology

## 2020-06-08 ENCOUNTER — Other Ambulatory Visit: Payer: Self-pay | Admitting: Obstetrics and Gynecology

## 2020-06-08 DIAGNOSIS — Z1231 Encounter for screening mammogram for malignant neoplasm of breast: Secondary | ICD-10-CM

## 2020-09-09 ENCOUNTER — Ambulatory Visit: Payer: Managed Care, Other (non HMO) | Admitting: Certified Nurse Midwife

## 2020-09-14 NOTE — Progress Notes (Signed)
49 y.o. G0P0 Married White or Caucasian Not Hispanic or Latino female here for annual exam.   H/O TLH/BSO last year for BRCA 2 mutation. Benign pathology. She is not on ERT. She is warm a lot, tolerable hot flashes. She is having night sweats most nights, overall tolerable.   Infrequently sexually active, no pain.   She has been diagnosed with inflammatory arthritis.     Patient's last menstrual period was 09/24/2019 (exact date).          Sexually active: Yes.    The current method of family planning is status post hysterectomy.    Exercising: No.  The patient does not participate in regular exercise at present. Smoker:  no  Health Maintenance: Pap: 09/08/2019 WNL HR HPVB Neg  08-30-17 neg HPV HR neg History of abnormal Pap:  Yes h/o CIN I in 2016, f/u after that was normal.  MMG:  Breast MRI 03/11/20 negative, Mammogram today results pending; Mammogram on 09/08/19 negative density B Bi-rads 1 neg  BMD:   None  Colonoscopy: none  TDaP:  2011 Gardasil: NA   reports that she has never smoked. She has never used smokeless tobacco. She reports that she does not drink alcohol and does not use drugs. They have a farm with about 70 animals. Wants to decrease the animals to goats and chickens.  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   Past Medical History:  Diagnosis Date  . Asthma    per pt triggers are laughing, cold drink, exercise  . Cold induced bronchospasm   . Family history of BRCA2 gene positive   . Family history of breast cancer   . Family history of ovarian cancer   . History of abnormal cervical Pap smear 07/2015  . Insomnia   . Restless legs syndrome (RLS)     Past Surgical History:  Procedure Laterality Date  . CYSTOSCOPY N/A 10/27/2019   Procedure: CYSTOSCOPY;  Surgeon: Jertson, Jill Evelyn, MD;  Location: [facility];  Service: Gynecology;  Laterality: N/A;  . CYSTOSCOPY W/ RETROGRADES Bilateral 12/16/2019   Procedure: CYSTOSCOPY WITH  RETROGRADE PYELOGRAM;  Surgeon: Manny, Theodore, MD;  Location: [facility];  Service: Urology;  Laterality: Bilateral;  . CYSTOSCOPY WITH BIOPSY N/A 12/16/2019   Procedure: CYSTOSCOPY WITH BIOPSY;  Surgeon: Manny, Theodore, MD;  Location: [facility];  Service: Urology;  Laterality: N/A;  45 MINS  . TOTAL LAPAROSCOPIC HYSTERECTOMY WITH BILATERAL SALPINGO OOPHORECTOMY Bilateral 10/27/2019   Procedure: TOTAL LAPAROSCOPIC HYSTERECTOMY WITH BILATERAL SALPINGO OOPHORECTOMY;  Surgeon: Jertson, Jill Evelyn, MD;  Location: [facility];  Service: Gynecology;  Laterality: Bilateral;  Extended recovery bed needed  . WISDOM TOOTH EXTRACTION      Current Outpatient Medications  Medication Sig Dispense Refill  . albuterol (PROVENTIL HFA;VENTOLIN HFA) 108 (90 Base) MCG/ACT inhaler Inhale 2 puffs, 15 minutes before physical activity 1 Inhaler 2  . clonazePAM (KLONOPIN) 0.5 MG tablet Take 0.5-1 mg by mouth at bedtime.      No current facility-administered medications for this visit.    Family History  Problem Relation Age of Onset  . Breast cancer Maternal Grandmother        dx younger than 60  . Cancer Maternal Grandfather        lung cancer  . Asthma Father   . Asthma Paternal Grandfather   . Asthma Maternal Aunt   . Ovarian cancer Mother          stage 4  . Cancer Mother        stomach wall  . BRCA 1/2 Mother        BRCA2 +  . Cerebral palsy Sister   . COPD Paternal 49   . Asthma Paternal Aunt   . Breast cancer Other        d. 38  . Asthma Paternal Aunt     Review of Systems  Musculoskeletal: Positive for joint swelling.  All other systems reviewed and are negative.   Exam:   BP 134/66   Pulse 76   Ht 5' 7" (1.702 m)   Wt 179 lb (81.2 kg)   LMP 09/24/2019 (Exact Date)   SpO2 98%   BMI 28.04 kg/m   Weight change: _0 @ Height:   Height: 5' 7" (170.2 cm)  Ht Readings from Last 3 Encounters:  09/15/20 5' 7" (1.702 m)   12/16/19 5' 8" (1.727 m)  11/03/19 5' 8" (1.727 m)    General appearance: alert, cooperative and appears stated age Head: Normocephalic, without obvious abnormality, atraumatic Neck: no adenopathy, supple, symmetrical, trachea midline and thyroid normal to inspection and palpation Lungs: clear to auscultation bilaterally Cardiovascular: regular rate and rhythm Breasts: normal appearance, no masses or tenderness Abdomen: soft, non-tender; non distended,  no masses,  no organomegaly Extremities: extremities normal, atraumatic, no cyanosis or edema Skin: Skin color, texture, turgor normal. No rashes or lesions Lymph nodes: Cervical, supraclavicular, and axillary nodes normal. No abnormal inguinal nodes palpated Neurologic: Grossly normal   Pelvic: External genitalia:  no lesions              Urethra:  normal appearing urethra with no masses, tenderness or lesions              Bartholins and Skenes: normal                 Vagina: mildly atrophic appearing vagina with normal color and discharge, no lesions              Cervix: absent               Bimanual Exam:  Uterus:  uterus absent              Adnexa: no mass, fullness, tenderness               Rectovaginal: Confirms               Anus:  normal sphincter tone, no lesions  Gae Dry chaperoned for the exam.  A:  Well Woman with normal exam  BRCA 2 gene mutation  S/P TLH/BSO last year  P:   Needs yearly pelvic exam and CA 125 for 10 years after her hysterectomy secondary to risk of peritoneal cancer  Other labs with primary  Mammogram just done today  Breast MRI in 3/22  Discussed breast self exam  Discussed calcium and vit D intake  TDAP due (we are out she will do with her primary)

## 2020-09-15 ENCOUNTER — Ambulatory Visit
Admission: RE | Admit: 2020-09-15 | Discharge: 2020-09-15 | Disposition: A | Discharge status: Home / Self Care | Payer: Managed Care, Other (non HMO) | Source: Ambulatory Visit | Attending: Obstetrics and Gynecology | Admitting: Obstetrics and Gynecology

## 2020-09-15 ENCOUNTER — Other Ambulatory Visit: Payer: Self-pay

## 2020-09-15 ENCOUNTER — Encounter: Payer: Self-pay | Admitting: Obstetrics and Gynecology

## 2020-09-15 ENCOUNTER — Ambulatory Visit (INDEPENDENT_AMBULATORY_CARE_PROVIDER_SITE_OTHER): Payer: Managed Care, Other (non HMO) | Admitting: Obstetrics and Gynecology

## 2020-09-15 VITALS — BP 134/66 | HR 76 | Ht 67.0 in | Wt 179.0 lb

## 2020-09-15 DIAGNOSIS — Z01419 Encounter for gynecological examination (general) (routine) without abnormal findings: Secondary | ICD-10-CM | POA: Diagnosis not present

## 2020-09-15 DIAGNOSIS — Z1501 Genetic susceptibility to malignant neoplasm of breast: Secondary | ICD-10-CM | POA: Diagnosis not present

## 2020-09-15 DIAGNOSIS — Z1231 Encounter for screening mammogram for malignant neoplasm of breast: Secondary | ICD-10-CM

## 2020-09-15 DIAGNOSIS — Z1509 Genetic susceptibility to other malignant neoplasm: Secondary | ICD-10-CM | POA: Diagnosis not present

## 2020-09-15 IMAGING — MG DIGITAL SCREENING BILAT W/ TOMO W/ CAD
8 series · 8 of 24 positions shown · non-contrast
Comparison: Previous exam(s).

CLINICAL DATA: Screening.

EXAM:
DIGITAL SCREENING BILATERAL MAMMOGRAM WITH TOMO AND CAD

[R MLO synth-2D]
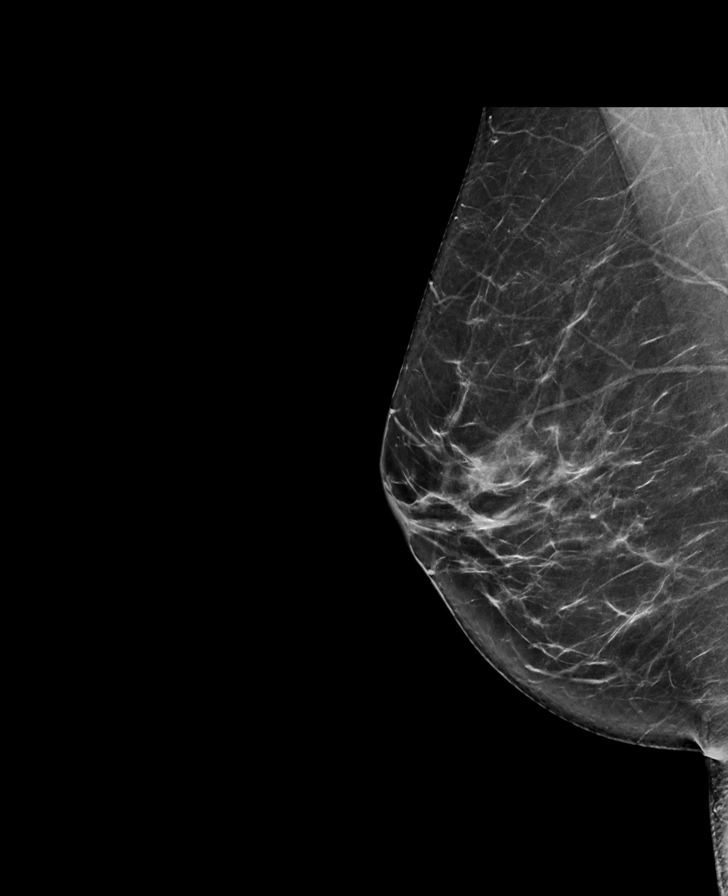

[R CC synth-2D]
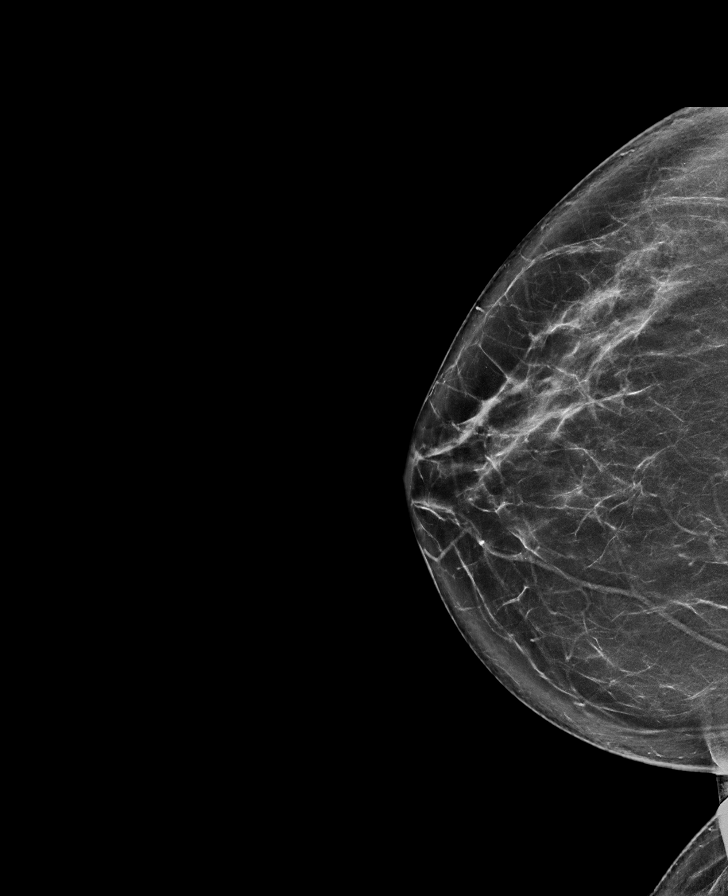

[L CC synth-2D]
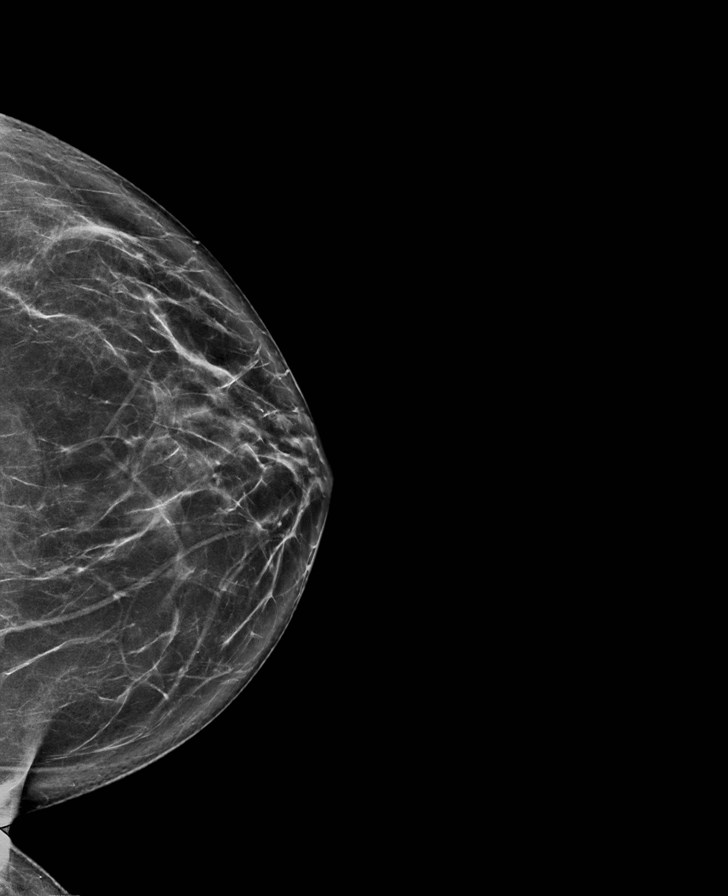

[L MLO synth-2D]
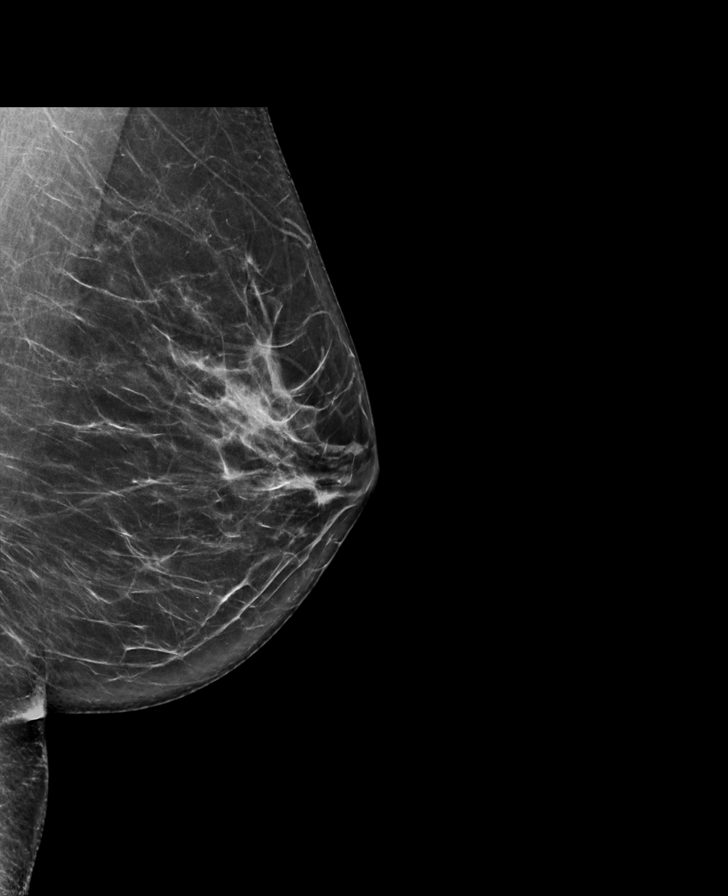

[L MLO tomo · tomo slice 39/77.0]
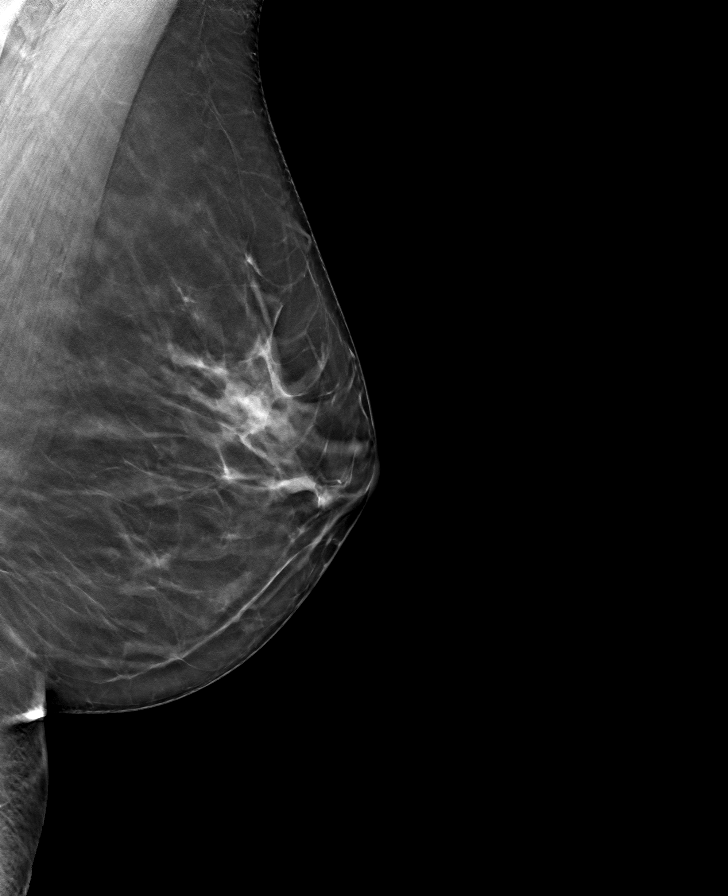

[L CC tomo · tomo slice 41/80.0]
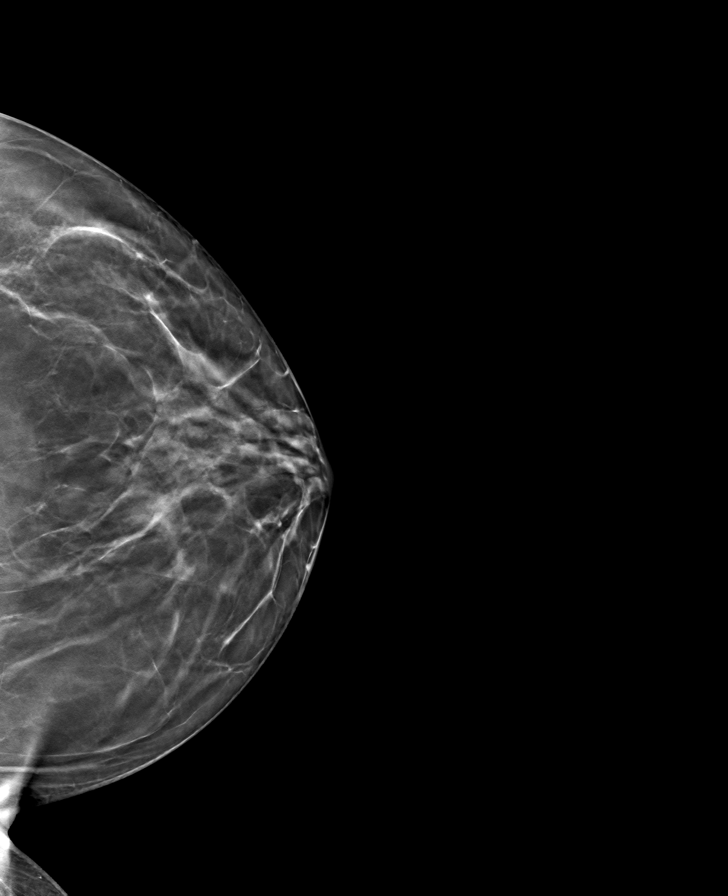

[R CC tomo · tomo slice 43/85.0]
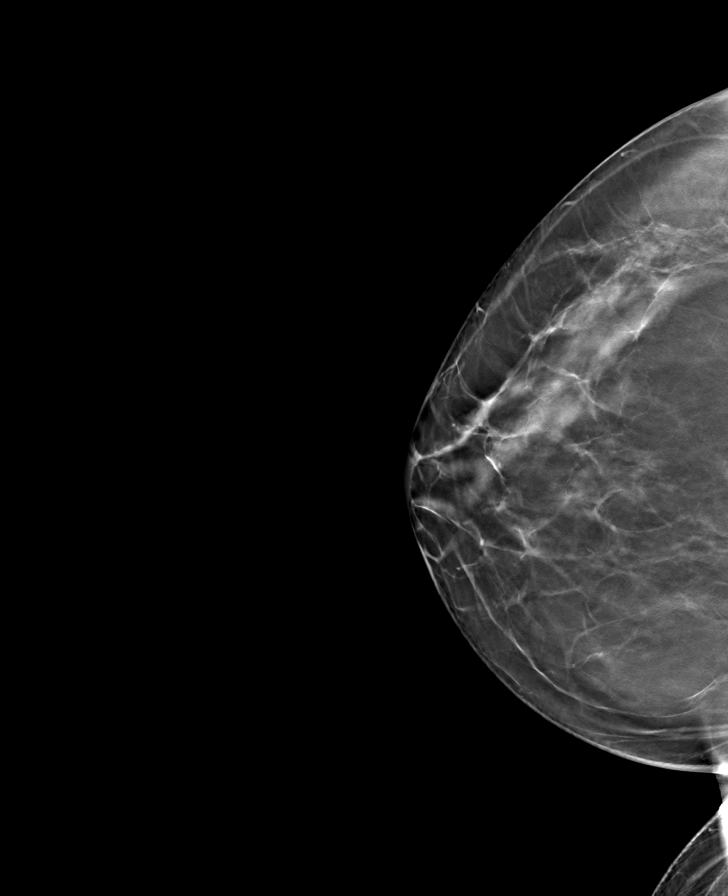

[R MLO tomo · tomo slice 41/81.0]
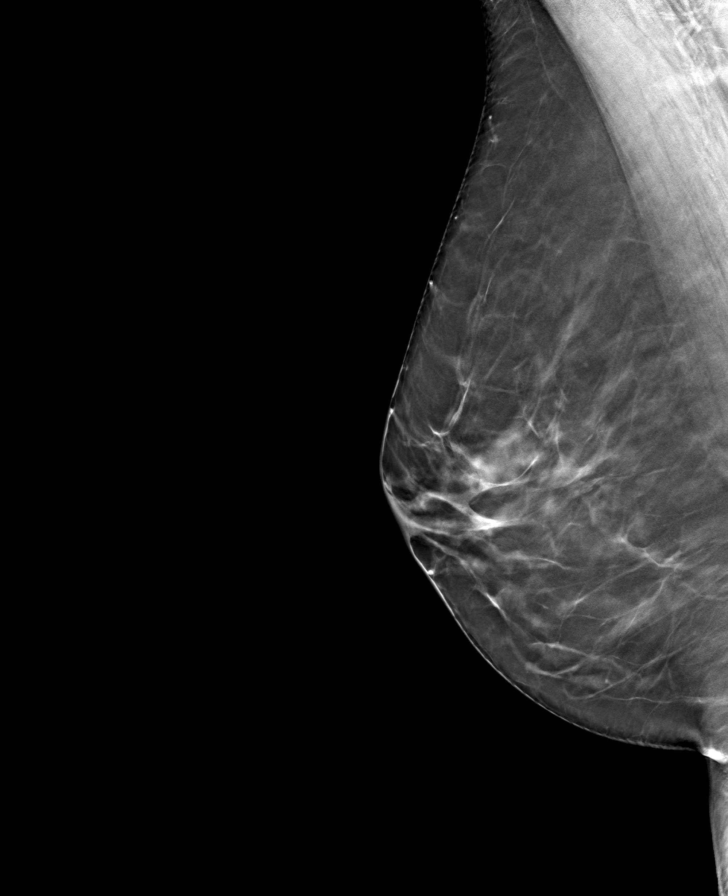

[8 of 24 positions shown; findings below may reference images not displayed]

ACR Breast Density Category b: There are scattered areas of
fibroglandular density.
FINDINGS: There are no findings suspicious for malignancy. Images were
processed with CAD.
IMPRESSION: No mammographic evidence of malignancy. A result letter of this
screening mammogram will be mailed directly to the patient.

RECOMMENDATION:
Screening mammogram in one year. (Code:[TQ])

BI-RADS CATEGORY  1: Negative.

## 2020-09-15 NOTE — Patient Instructions (Signed)
Marland KitchenEXERCISE AND DIET:  We recommended that you start or continue a regular exercise program for good health. Regular exercise means any activity that makes your heart beat faster and makes you sweat.  We recommend exercising at least 30 minutes per day at least 3 days a week, preferably 4 or 5.  We also recommend a diet low in fat and sugar.  Inactivity, poor dietary choices and obesity can cause diabetes, heart attack, stroke, and kidney damage, among others.    ALCOHOL AND SMOKING:  Women should limit their alcohol intake to no more than 7 drinks/beers/glasses of wine (combined, not each!) per week. Moderation of alcohol intake to this level decreases your risk of breast cancer and liver damage. And of course, no recreational drugs are part of a healthy lifestyle.  And absolutely no smoking or even second hand smoke. Most people know smoking can cause heart and lung diseases, but did you know it also contributes to weakening of your bones? Aging of your skin?  Yellowing of your teeth and nails?  CALCIUM AND VITAMIN D:  Adequate intake of calcium and Vitamin D are recommended.  The recommendations for exact amounts of these supplements seem to change often, but generally speaking a,200 mg of calcium (between diet and supplement) and 800 units of Vitamin D per day seems prudent. Certain women may benefit from higher intake of Vitamin D.  If you are among these women, your doctor will have told you during your visit.    PAP SMEARS:  Pap smears, to check for cervical cancer or precancers,  have traditionally been done yearly, although recent scientific advances have shown that most women can have pap smears less often.  However, every woman still should have a physical exam from her gynecologist every year. It will include a breast check, inspection of the vulva and vagina to check for abnormal growths or skin changes, a visual exam of the cervix, and then an exam to evaluate the size and shape of the uterus and  ovaries.  And after 49 years of age, a rectal exam is indicated to check for rectal cancers. We will also provide age appropriate advice regarding health maintenance, like when you should have certain vaccines, screening for sexually transmitted diseases, bone density testing, colonoscopy, mammograms, etc.   MAMMOGRAMS:  All women over 15 years old should have a yearly mammogram. Many facilities now offer a "3D" mammogram, which may cost around $50 extra out of pocket. If possible,  we recommend you accept the option to have the 3D mammogram performed.  It both reduces the number of women who will be called back for extra views which then turn out to be normal, and it is better than the routine mammogram at detecting truly abnormal areas.    COLON CANCER SCREENING: Now recommend starting at age 1. At this time colonoscopy is not covered for routine screening until 50. There are take home tests that can be done between 45-49.   COLONOSCOPY:  Colonoscopy to screen for colon cancer is recommended for all women at age 64.  We know, you hate the idea of the prep.  We agree, BUT, having colon cancer and not knowing it is worse!!  Colon cancer so often starts as a polyp that can be seen and removed at colonscopy, which can quite literally save your life!  And if your first colonoscopy is normal and you have no family history of colon cancer, most women don't have to have it again for  10 years.  Once every ten years, you can do something that may end up saving your life, right?  We will be happy to help you get it scheduled when you are ready.  Be sure to check your insurance coverage so you understand how much it will cost.  It may be covered as a preventative service at no cost, but you should check your particular policy.      Breast Self-Awareness Breast self-awareness means being familiar with how your breasts look and feel. It involves checking your breasts regularly and reporting any changes to your  health care provider. Practicing breast self-awareness is important. A change in your breasts can be a sign of a serious medical problem. Being familiar with how your breasts look and feel allows you to find any problems early, when treatment is more likely to be successful. All women should practice breast self-awareness, including women who have had breast implants. How to do a breast self-exam One way to learn what is normal for your breasts and whether your breasts are changing is to do a breast self-exam. To do a breast self-exam: Look for Changes  1. Remove all the clothing above your waist. 2. Stand in front of a mirror in a room with good lighting. 3. Put your hands on your hips. 4. Push your hands firmly downward. 5. Compare your breasts in the mirror. Look for differences between them (asymmetry), such as: ? Differences in shape. ? Differences in size. ? Puckers, dips, and bumps in one breast and not the other. 6. Look at each breast for changes in your skin, such as: ? Redness. ? Scaly areas. 7. Look for changes in your nipples, such as: ? Discharge. ? Bleeding. ? Dimpling. ? Redness. ? A change in position. Feel for Changes Carefully feel your breasts for lumps and changes. It is best to do this while lying on your back on the floor and again while sitting or standing in the shower or tub with soapy water on your skin. Feel each breast in the following way:  Place the arm on the side of the breast you are examining above your head.  Feel your breast with the other hand.  Start in the nipple area and make  inch (2 cm) overlapping circles to feel your breast. Use the pads of your three middle fingers to do this. Apply light pressure, then medium pressure, then firm pressure. The light pressure will allow you to feel the tissue closest to the skin. The medium pressure will allow you to feel the tissue that is a little deeper. The firm pressure will allow you to feel the tissue  close to the ribs.  Continue the overlapping circles, moving downward over the breast until you feel your ribs below your breast.  Move one finger-width toward the center of the body. Continue to use the  inch (2 cm) overlapping circles to feel your breast as you move slowly up toward your collarbone.  Continue the up and down exam using all three pressures until you reach your armpit.  Write Down What You Find  Write down what is normal for each breast and any changes that you find. Keep a written record with breast changes or normal findings for each breast. By writing this information down, you do not need to depend only on memory for size, tenderness, or location. Write down where you are in your menstrual cycle, if you are still menstruating. If you are having trouble noticing differences   in your breasts, do not get discouraged. With time you will become more familiar with the variations in your breasts and more comfortable with the exam. How often should I examine my breasts? Examine your breasts every month. If you are breastfeeding, the best time to examine your breasts is after a feeding or after using a breast pump. If you menstruate, the best time to examine your breasts is 5-7 days after your period is over. During your period, your breasts are lumpier, and it may be more difficult to notice changes. When should I see my health care provider? See your health care provider if you notice:  A change in shape or size of your breasts or nipples.  A change in the skin of your breast or nipples, such as a reddened or scaly area.  Unusual discharge from your nipples.  A lump or thick area that was not there before.  Pain in your breasts.  Anything that concerns you.  

## 2020-09-16 LAB — CA 125: Cancer Antigen (CA) 125: 11.1 U/mL (ref 0.0–38.1)

## 2020-11-02 NOTE — Progress Notes (Signed)
Patient Care Team: Derenda Mis, MD as PCP - General (Internal Medicine) Regina Eck, CNM as Consulting Physician (Certified Nurse Midwife)  DIAGNOSIS:    ICD-10-CM   1. BRCA2 gene mutation positive  Z15.01    Z15.09     CHIEF COMPLIANT: Follow-up of high risk for breast cancer, BRCA2 mutation positive  INTERVAL HISTORY: Jasmin Duran is a 49 y.o. with above-mentioned history of high risk for breast cancer and BRCA2 mutation positive. Breast MRI on 03/11/20 showed no evidence of malignancy bilaterally. Mammogram on 09/15/20 showed no evidence of malignancy bilaterally. She presents to the clinic today for follow-up.  Patient was diagnosed with rheumatoid arthritis and is currently on hydroxychloroquine.  She developed swelling of the hands after receiving the J&J vaccine earlier this year.  She was treated with prednisone and it got better but then after she stopped it got worse so then she was placed on hydroxychloroquine and she is also on 5 mg of prednisone currently.  Her fingers are working much better.  She denies any lumps or nodules in the breast.  ALLERGIES:  has No Known Allergies.  MEDICATIONS:  Current Outpatient Medications  Medication Sig Dispense Refill  . albuterol (PROVENTIL HFA;VENTOLIN HFA) 108 (90 Base) MCG/ACT inhaler Inhale 2 puffs, 15 minutes before physical activity 1 Inhaler 2  . clonazePAM (KLONOPIN) 0.5 MG tablet Take 0.5-1 mg by mouth at bedtime.     . hydroxychloroquine (PLAQUENIL) 200 MG tablet Take 1 tablet (200 mg total) by mouth 2 (two) times daily.    . predniSONE (DELTASONE) 5 MG tablet Take 1 tablet (5 mg total) by mouth daily with breakfast.     No current facility-administered medications for this visit.    PHYSICAL EXAMINATION: ECOG PERFORMANCE STATUS: 1 - Symptomatic but completely ambulatory  Vitals:   11/03/20 1028  BP: (!) 143/87  Pulse: 88  Resp: 18  Temp: 98.5 F (36.9 C)  SpO2: 100%   Filed Weights   11/03/20  1028  Weight: 183 lb 9.6 oz (83.3 kg)    BREAST: No palpable masses or nodules in either right or left breasts. No palpable axillary supraclavicular or infraclavicular adenopathy no breast tenderness or nipple discharge. (exam performed in the presence of a chaperone)  LABORATORY DATA:  I have reviewed the data as listed CMP Latest Ref Rng & Units 12/16/2019 10/23/2019 02/20/2018  Glucose 70 - 99 mg/dL 87 90 90  BUN 6 - 20 mg/dL 29(H) 12 18  Creatinine 0.44 - 1.00 mg/dL 0.80 0.83 0.90  Sodium 135 - 145 mmol/L 144 140 140  Potassium 3.5 - 5.1 mmol/L 3.4(L) 3.8 4.1  Chloride 98 - 111 mmol/L 108 108 106  CO2 22 - 32 mmol/L - 23 26  Calcium 8.9 - 10.3 mg/dL - 9.0 9.6  Total Protein 6.5 - 8.1 g/dL - 7.9 7.2  Total Bilirubin 0.3 - 1.2 mg/dL - 0.4 0.5  Alkaline Phos 38 - 126 U/L - 63 -  AST 15 - 41 U/L - 15 12  ALT 0 - 44 U/L - 13 9    Lab Results  Component Value Date   WBC 8.5 10/27/2019   HGB 13.9 12/16/2019   HCT 41.0 12/16/2019   MCV 93.6 10/27/2019   PLT 282 10/27/2019   NEUTROABS 4.2 10/23/2019    ASSESSMENT & PLAN:  BRCA2 gene mutation positive Cancer risk:  Average to risk of cancer by age of 71: (Antoniou et al pooled pedigree data from 87 studies of 8139 index  patients with breast or ovarian cancer) Breast cancer risk: 45 percent (95% CI, 33 to 54 percent) Ovarian cancer risk: 11 percent (95% CI, 4.1 to 18 percent)  Other cancer risks:  1. Pancreatic cancer (relative risk is 3.51) with incidence of 4.9% in BRCA2 carriers 2. Colorectal cancers: In many studies there was no increased risk But there may be some for BRCA1 carriers 3. Melanoma and other skin cancers: The risk of oatmeal melanoma is increase in BRCA2 mutation carriers but it still extremely rare. ------------------------------------------------------------------------------------------------------------------ Breast cancer surveillance: 1.  Breast exam 11/03/2020: Benign 2. Mammogram 09/15/2020: Benign  breast density category B 3.  Breast MRI 03/11/2020: Unremarkable Breast density category B  Recent diagnosis of rheumatoid arthritis: Currently on hydroxychloroquine.  The patient underwent bilateral salpingo-oophorectomy and hysterectomy November 2020 for ovarian cancer risk reduction Return to clinic in 1 year for follow-up    No orders of the defined types were placed in this encounter.  The patient has a good understanding of the overall plan. she agrees with it. she will call with any problems that may develop before the next visit here.  Total time spent: 20 mins including face to face time and time spent for planning, charting and coordination of care  Nicholas Lose, MD 11/03/2020  I, Cloyde Reams Dorshimer, am acting as scribe for Dr. Nicholas Lose.  I have reviewed the above documentation for accuracy and completeness, and I agree with the above.

## 2020-11-03 ENCOUNTER — Other Ambulatory Visit: Payer: Self-pay | Admitting: Obstetrics and Gynecology

## 2020-11-03 ENCOUNTER — Inpatient Hospital Stay: Payer: Managed Care, Other (non HMO) | Attending: Hematology and Oncology | Admitting: Hematology and Oncology

## 2020-11-03 ENCOUNTER — Other Ambulatory Visit: Payer: Self-pay

## 2020-11-03 DIAGNOSIS — Z1509 Genetic susceptibility to other malignant neoplasm: Secondary | ICD-10-CM | POA: Diagnosis not present

## 2020-11-03 DIAGNOSIS — Z8041 Family history of malignant neoplasm of ovary: Secondary | ICD-10-CM | POA: Diagnosis not present

## 2020-11-03 DIAGNOSIS — Z803 Family history of malignant neoplasm of breast: Secondary | ICD-10-CM | POA: Insufficient documentation

## 2020-11-03 DIAGNOSIS — Z1501 Genetic susceptibility to malignant neoplasm of breast: Secondary | ICD-10-CM | POA: Diagnosis present

## 2020-11-03 DIAGNOSIS — Z1231 Encounter for screening mammogram for malignant neoplasm of breast: Secondary | ICD-10-CM

## 2020-11-03 DIAGNOSIS — M069 Rheumatoid arthritis, unspecified: Secondary | ICD-10-CM | POA: Diagnosis present

## 2020-11-03 MED ORDER — PREDNISONE 5 MG PO TABS: 5.0000 mg | ORAL_TABLET | Freq: Every day | ORAL | Status: DC

## 2020-11-03 MED ORDER — HYDROXYCHLOROQUINE SULFATE 200 MG PO TABS: 200.0000 mg | ORAL_TABLET | Freq: Two times a day (BID) | ORAL | Status: DC

## 2020-11-03 NOTE — Assessment & Plan Note (Signed)
Cancer risk:  Average to risk of cancer by age of 13: (Antoniou et al pooled pedigree data from 55 studies of 8139 index patients with breast or ovarian cancer) Breast cancer risk: 45 percent (95% CI, 33 to 54 percent) Ovarian cancer risk: 11 percent (95% CI, 4.1 to 18 percent)  Other cancer risks:  1. Pancreatic cancer (relative risk is 3.51) with incidence of 4.9% in BRCA2 carriers 2. Colorectal cancers: In many studies there was no increased risk But there may be some for BRCA1 carriers 3. Melanoma and other skin cancers: The risk of oatmeal melanoma is increase in BRCA2 mutation carriers but it still extremely rare. ------------------------------------------------------------------------------------------------------------------ Breast cancer surveillance: 1.  Breast exam 11/03/2020: Benign 2. Mammogram 09/15/2020: Benign breast density category B 3.  Breast MRI 03/11/2020: Unremarkable Breast density category B  The patient underwent bilateral salpingo-oophorectomy and hysterectomy November 2020 for ovarian cancer risk reduction Return to clinic in 1 year for follow-up

## 2021-03-15 ENCOUNTER — Ambulatory Visit
Admission: RE | Admit: 2021-03-15 | Discharge: 2021-03-15 | Disposition: A | Discharge status: Home / Self Care | Payer: Managed Care, Other (non HMO) | Source: Ambulatory Visit | Attending: Hematology and Oncology | Admitting: Hematology and Oncology

## 2021-03-15 ENCOUNTER — Other Ambulatory Visit: Payer: Self-pay

## 2021-03-15 DIAGNOSIS — Z1501 Genetic susceptibility to malignant neoplasm of breast: Secondary | ICD-10-CM

## 2021-03-15 DIAGNOSIS — Z1509 Genetic susceptibility to other malignant neoplasm: Secondary | ICD-10-CM

## 2021-03-15 IMAGING — MR MR BREAST BILAT WO/W CM
8 of 12 series · 34 of 48 positions shown · IV contrast (8 ml Gadavist)
Comparison: Previous exam(s).

CLINICAL DATA: Patient with [6C] mutation. High risk screening
MRI.

EXAM:
BILATERAL BREAST MRI WITH AND WITHOUT CONTRAST
TECHNIQUE: Multiplanar, multisequence MR images of both breasts were obtained
prior to and following the intravenous administration of 8 ml of
Gadavist

[Series 3: t2_tirm_tra ipat (a-p) · axial · 3.0mm · 0.76mm/px · 1 of 61 slices shown]
[im 1/61]
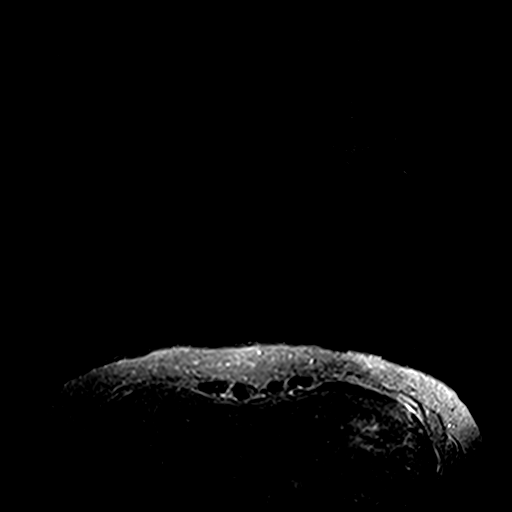

[Series 4: fl3d pre-cm no · axial · non-contrast · 1.2mm · 1.02mm/px · z∈[-60,+130]mm · 5 of 160 slices shown]
[im 1/160]
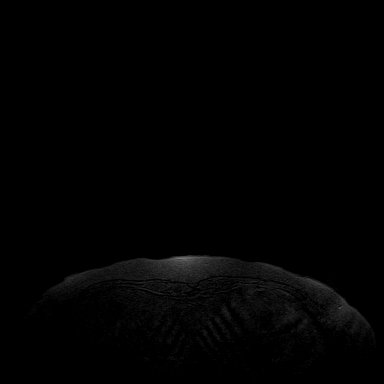
[im 40/160]
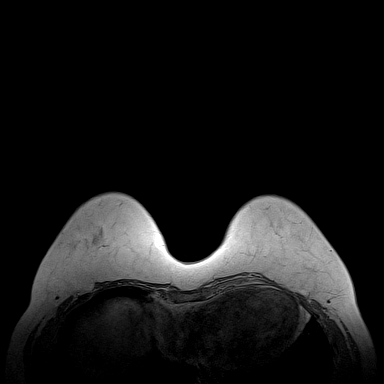
[im 80/160]
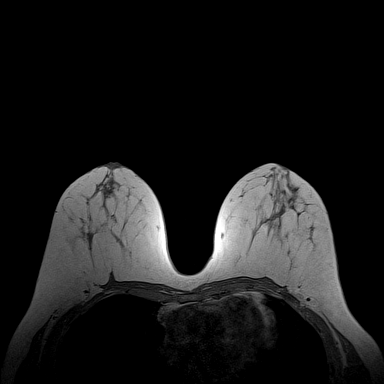
[im 120/160]
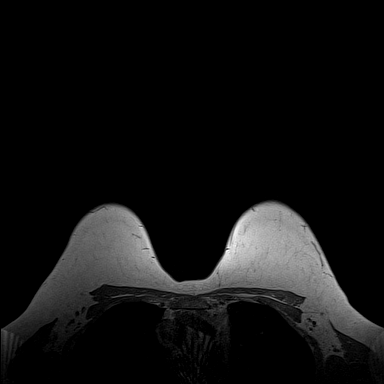
[im 160/160]
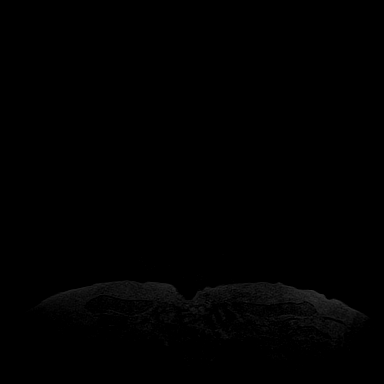

[Series 5: fl3d pre-cm · axial · non-contrast · 1.2mm · 1.02mm/px · z∈[-60,+130]mm · 5 of 160 slices shown]
[im 1/160]
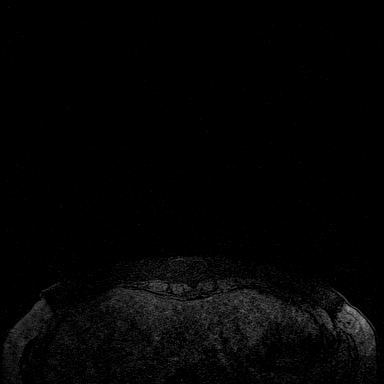
[im 40/160]
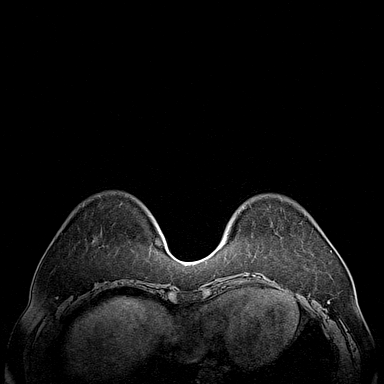
[im 80/160]
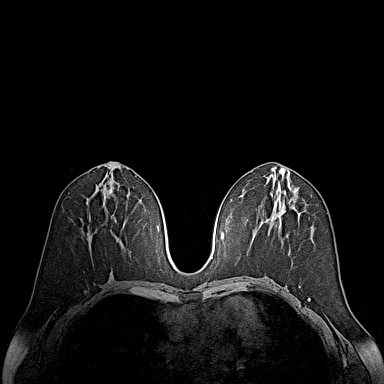
[im 120/160]
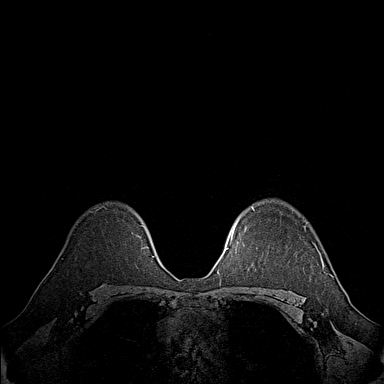
[im 160/160]
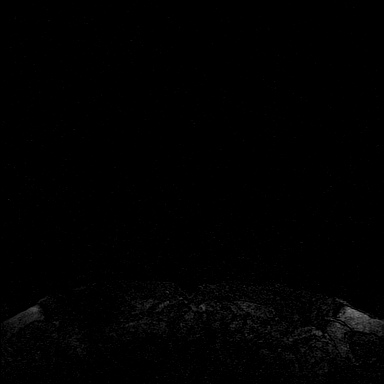

[Series 6: fl3d post-cm 20 · axial · 1.2mm · 1.02mm/px · z∈[-60,+130]mm · 5 of 160 slices shown (1 of 3)]
[im 1/160]
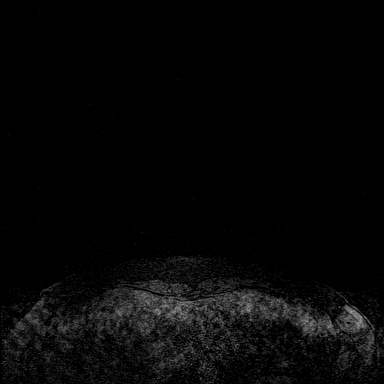
[im 40/160]
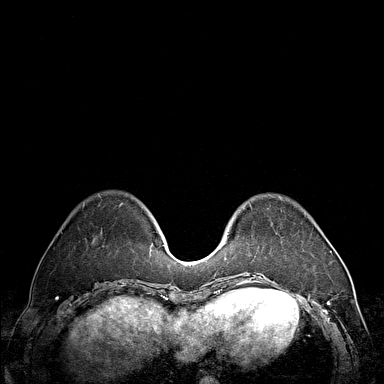
[im 80/160]
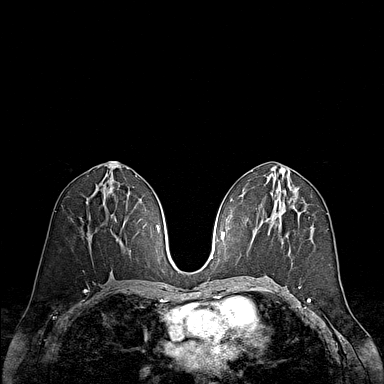
[im 120/160]
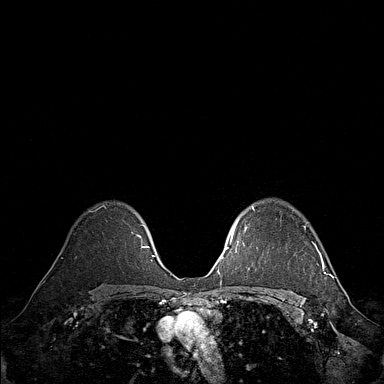
[im 160/160]
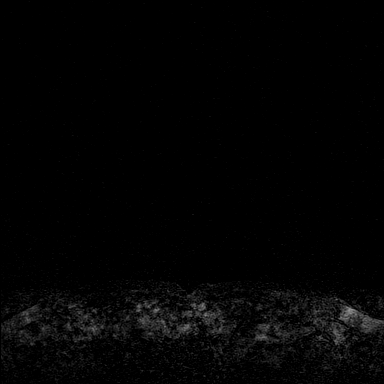

[Series 7: fl3d post-cm 20 · axial · 1.2mm · 1.02mm/px · z∈[-60,+130]mm · 5 of 160 slices shown (2 of 3)]
[im 1/160]
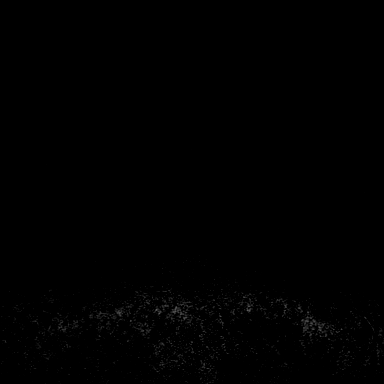
[im 40/160]
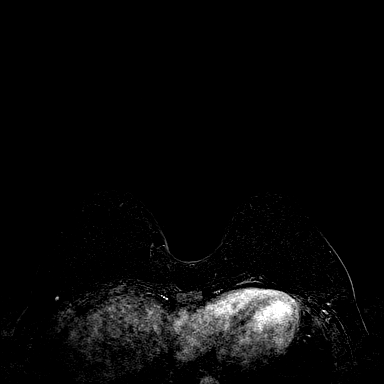
[im 80/160]
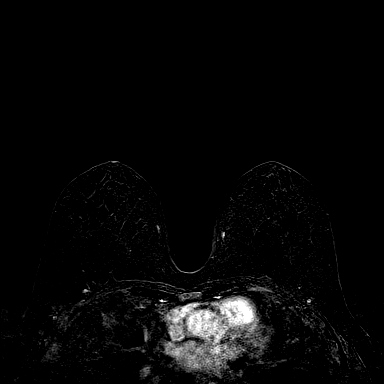
[im 120/160]
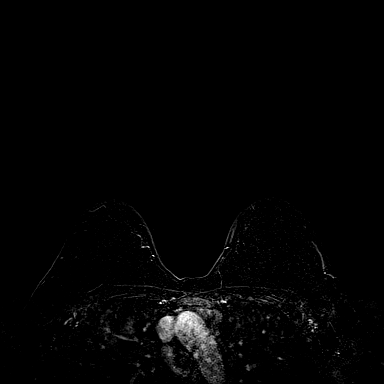
[im 160/160]
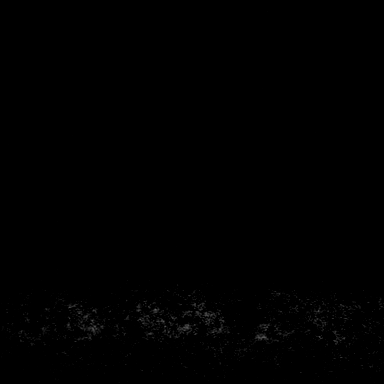

[Series 8: fl3d post-cm 20 · axial · 192.0mm · 1.02mm/px · 1 of 1 slices shown (3 of 3)]
[im 1/1]
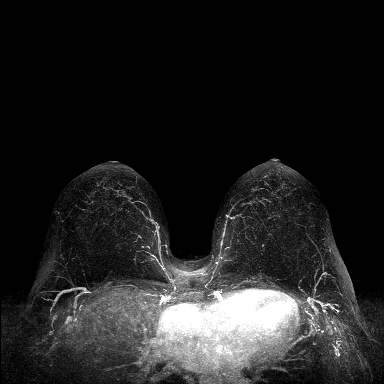

[Series 9: fl3d post-cm 3min · axial · 1.2mm · 1.02mm/px · z∈[-60,+130]mm · 6 of 160 slices shown]
[im 1/160]
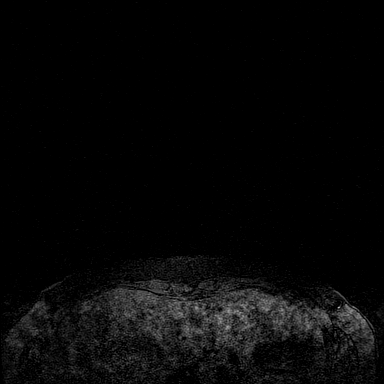
[im 32/160]
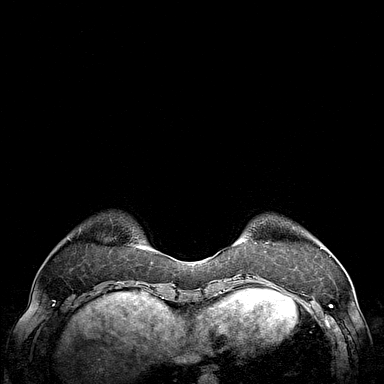
[im 64/160]
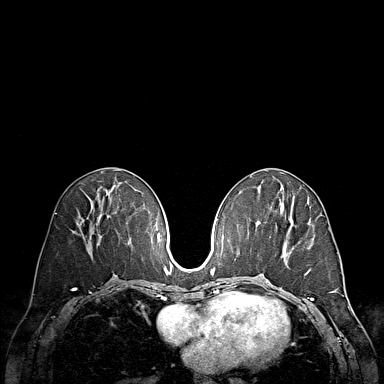
[im 96/160]
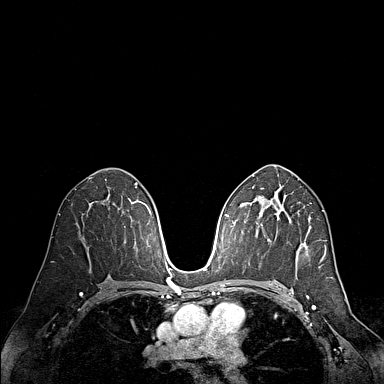
[im 128/160]
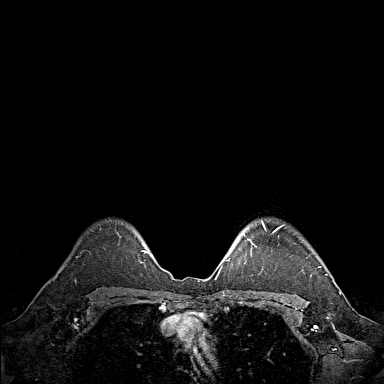
[im 160/160]
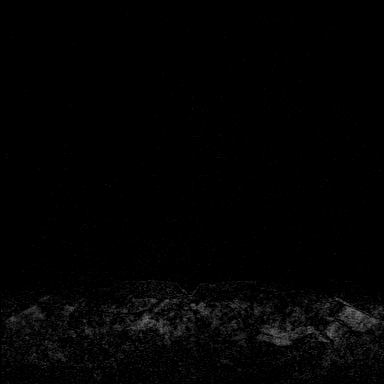

[Series 10: fl3d post-cm 3min_sub · axial · 1.2mm · 1.02mm/px · z∈[-60,+130]mm · 6 of 160 slices shown]
[im 1/160]
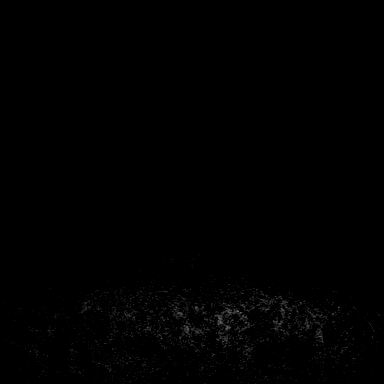
[im 32/160]
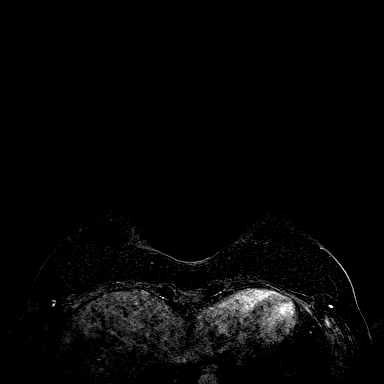
[im 64/160]
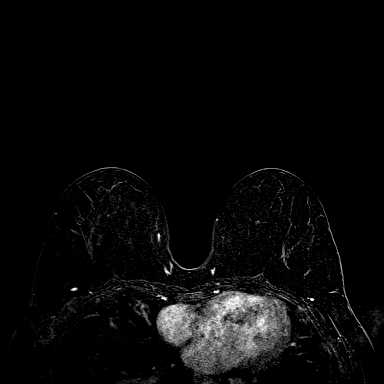
[im 96/160]
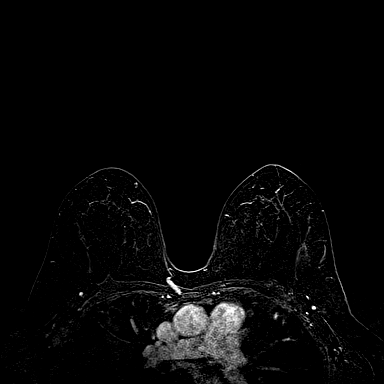
[im 128/160]
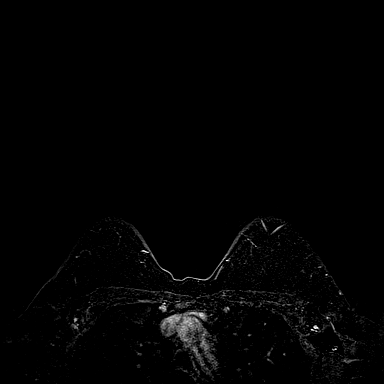
[im 160/160]
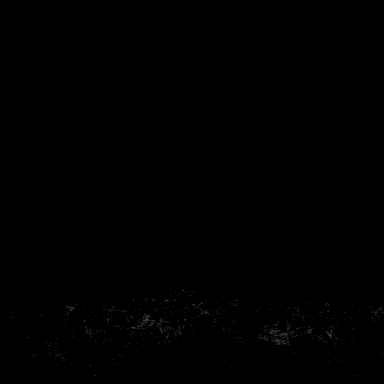

[34 of 48 positions shown; findings below may reference images not displayed]

Three-dimensional MR images were rendered by post-processing of the
original MR data on an independent workstation. The
three-dimensional MR images were interpreted, and findings are
reported in the following complete MRI report for this study. Three
dimensional images were evaluated at the independent interpreting
workstation using the DynaCAD thin client.
FINDINGS: Breast composition: b. Scattered fibroglandular tissue.

Background parenchymal enhancement: Moderate.

Right breast: No mass or abnormal enhancement.

Left breast: No mass or abnormal enhancement.

Lymph nodes: No abnormal appearing lymph nodes.

Ancillary findings:  None.
IMPRESSION: No MRI evidence to suggest malignancy within either breast.

RECOMMENDATION:
Continue with annual high risk screening MRI.

Annual screening mammography.

BI-RADS CATEGORY  1: Negative.

## 2021-03-15 MED ORDER — GADOBUTROL 1 MMOL/ML IV SOLN
8.0000 mL | Freq: Once | INTRAVENOUS | Status: AC | PRN
  Administered 2021-03-15: 8 mL via INTRAVENOUS

## 2021-09-25 NOTE — Progress Notes (Signed)
50 y.o. G0P0 Married White or Caucasian Not Hispanic or Latino female here for annual exam.    H/O TLH/BSO for BRCA 2 mutation. Benign pathology.  One episode of vaginal spotting 6 weeks ago. It was random, no recent intercourse, no straining. She only noticed it when she wiped.  Not sexually active, no libido, life is busy.   No bowel or bladder issues.    Patient's last menstrual period was 09/24/2019 (exact date).          Sexually active: No.  The current method of family planning is status post hysterectomy.    Exercising: Yes.    The patient has a physically strenuous job, but has no regular exercise apart from work.  Smoker:  no  Health Maintenance: Pap:  09/08/2019 WNL HR HPV Neg;  08-30-17 neg HPV HR neg  History of abnormal Pap:  Yes h/o CIN I in 2016, f/u after that was normal.  MMG: 09/15/20 Bi-rads cat 1 Neg Breast MRI 03/15/21 Bi-rads 1 Neg  BMD:   none  Colonoscopy: none  TDaP:  10/06/20  Gardasil: na   reports that she has never smoked. She has never used smokeless tobacco. 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 They milk the goats, use the milk and make cheese.   Past Medical History:  Diagnosis Date   Asthma    per pt triggers are laughing, cold drink, exercise   Cold induced bronchospasm    Family history of BRCA2 gene positive    Family history of breast cancer    Family history of ovarian cancer    History of abnormal cervical Pap smear 07/2015   Inflammatory arthritis    Insomnia    Restless legs syndrome (RLS)     Past Surgical History:  Procedure Laterality Date   CYSTOSCOPY N/A 10/27/2019   Procedure: CYSTOSCOPY;  Surgeon: Salvadore Dom, MD;  Location: [facility];  Service: Gynecology;  Laterality: N/A;   CYSTOSCOPY W/ RETROGRADES Bilateral 12/16/2019   Procedure:  CYSTOSCOPY WITH RETROGRADE PYELOGRAM;  Surgeon: Alexis Frock, MD;  Location: [facility];  Service: Urology;  Laterality: Bilateral;   CYSTOSCOPY WITH BIOPSY N/A 12/16/2019   Procedure: CYSTOSCOPY WITH BIOPSY;  Surgeon: Alexis Frock, MD;  Location: [facility];  Service: Urology;  Laterality: N/A;  45 MINS   TOTAL LAPAROSCOPIC HYSTERECTOMY WITH BILATERAL SALPINGO OOPHORECTOMY Bilateral 10/27/2019   Procedure: TOTAL LAPAROSCOPIC HYSTERECTOMY WITH BILATERAL SALPINGO OOPHORECTOMY;  Surgeon: Salvadore Dom, MD;  Location: [facility];  Service: Gynecology;  Laterality: Bilateral;  Extended recovery bed needed   WISDOM TOOTH EXTRACTION      Current Outpatient Medications  Medication Sig Dispense Refill   albuterol (PROVENTIL HFA;VENTOLIN HFA) 108 (90 Base) MCG/ACT inhaler Inhale 2 puffs, 15 minutes before physical activity 1 Inhaler 2   clonazePAM (KLONOPIN) 0.5 MG tablet Take 0.5-1 mg by mouth at bedtime.      hydroxychloroquine (PLAQUENIL) 200 MG tablet Take 1 tablet (200 mg total) by mouth 2 (two) times daily.     predniSONE (DELTASONE) 5 MG tablet Take 1 tablet (5 mg total) by mouth daily with breakfast.     No current facility-administered medications for this visit.    Family History  Problem Relation Age of Onset   Breast cancer Maternal Grandmother  dx younger than 12   Cancer Maternal Grandfather        lung cancer   Asthma Father    Asthma Paternal Grandfather    Asthma Maternal Aunt    Ovarian cancer Mother        stage 57   Cancer Mother        stomach wall   BRCA 1/2 Mother        BRCA2 +   Cerebral palsy Sister    COPD Paternal Aunt    Asthma Paternal Aunt    Breast cancer Other        d. 53   Asthma Paternal Aunt     Review of Systems  Genitourinary:  Positive for vaginal bleeding.  All other systems reviewed and are negative.  Exam:   BP 132/80   Pulse 87   Ht '5\' 8"'  (1.727 m)   Wt 193 lb (87.5 kg)    LMP 09/24/2019 (Exact Date)   SpO2 98%   BMI 29.35 kg/m   Weight change: '@WEIGHTCHANGE' @ Height:   Height: '5\' 8"'  (172.7 cm)  Ht Readings from Last 3 Encounters:  09/27/21 '5\' 8"'  (1.727 m)  11/03/20 '5\' 7"'  (1.702 m)  09/15/20 '5\' 7"'  (1.702 m)    General appearance: alert, cooperative and appears stated age Head: Normocephalic, without obvious abnormality, atraumatic Neck: no adenopathy, supple, symmetrical, trachea midline and thyroid normal to inspection and palpation Lungs: clear to auscultation bilaterally Cardiovascular: regular rate and rhythm Breasts: normal appearance, no masses or tenderness Abdomen: soft, non-tender; non distended,  no masses,  no organomegaly Extremities: extremities normal, atraumatic, no cyanosis or edema Skin: Skin color, texture, turgor normal. No rashes or lesions Lymph nodes: Cervical, supraclavicular, and axillary nodes normal. No abnormal inguinal nodes palpated Neurologic: Grossly normal   Pelvic: External genitalia:  no lesions              Urethra:  normal appearing urethra with no masses, tenderness or lesions              Bartholins and Skenes: normal                 Vagina: mildly atrophic appearing vagina with normal color and discharge, no lesions. Nothing noted on exam to explain the spotting.               Cervix: absent               Bimanual Exam:  Uterus:  uterus absent              Adnexa: no mass, fullness, tenderness               Rectovaginal: Confirms               Anus:  normal sphincter tone, no lesions    1. Well woman exam Discussed breast self exam Discussed calcium and vit D intake No pap today Nothing to explain the one episode of spotting, call if it occurs again.  2. Colon cancer screening - Ambulatory referral to Gastroenterology  3. BRCA2 gene mutation positive Getting yearly mammograms and MRI's  4. Status post laparoscopic hysterectomy  5. History of bilateral salpingo-oophorectomy (BSO)  6. Family  history of ovarian cancer  7. Laboratory exam ordered as part of routine general medical examination - CBC - Comprehensive metabolic panel - Lipid panel

## 2021-09-26 NOTE — Progress Notes (Signed)
Patient Care Team: Derenda Mis, MD as PCP - General (Internal Medicine) Regina Eck, CNM as Consulting Physician (Certified Nurse Midwife)  DIAGNOSIS:    ICD-10-CM   1. BRCA2 gene mutation positive  Z15.01    Z15.09         CHIEF COMPLIANT: Follow-up of high risk for breast cancer, BRCA2 mutation positive  INTERVAL HISTORY: Jasmin Duran is a 50 y.o. with above-mentioned history of high risk for breast cancer and BRCA2 mutation positive. Breast MRI on 03/15/2021 showed no evidence of malignancy bilaterally. She presents to the clinic today for follow-up.  She is currently off of Plaquenil and prednisone and she is doing quite well from the joint symptoms standpoint.  She has gained weight because of prednisone and is working hard to lose the weight.  ALLERGIES:  has No Known Allergies.  MEDICATIONS:  Current Outpatient Medications  Medication Sig Dispense Refill   albuterol (PROVENTIL HFA;VENTOLIN HFA) 108 (90 Base) MCG/ACT inhaler Inhale 2 puffs, 15 minutes before physical activity 1 Inhaler 2   clonazePAM (KLONOPIN) 0.5 MG tablet Take 0.5-1 mg by mouth at bedtime.      hydroxychloroquine (PLAQUENIL) 200 MG tablet Take 1 tablet (200 mg total) by mouth 2 (two) times daily.     predniSONE (DELTASONE) 5 MG tablet Take 1 tablet (5 mg total) by mouth daily with breakfast.     No current facility-administered medications for this visit.    PHYSICAL EXAMINATION: ECOG PERFORMANCE STATUS: 1 - Symptomatic but completely ambulatory  Vitals:   09/27/21 1108  BP: (!) 146/91  Pulse: 80  Resp: 18  Temp: 97.7 F (36.5 C)  SpO2: 100%   Filed Weights   09/27/21 1108  Weight: 188 lb 1.6 oz (85.3 kg)      LABORATORY DATA:  I have reviewed the data as listed CMP Latest Ref Rng & Units 12/16/2019 10/23/2019 02/20/2018  Glucose 70 - 99 mg/dL 87 90 90  BUN 6 - 20 mg/dL 29(H) 12 18  Creatinine 0.44 - 1.00 mg/dL 0.80 0.83 0.90  Sodium 135 - 145 mmol/L 144 140 140   Potassium 3.5 - 5.1 mmol/L 3.4(L) 3.8 4.1  Chloride 98 - 111 mmol/L 108 108 106  CO2 22 - 32 mmol/L - 23 26  Calcium 8.9 - 10.3 mg/dL - 9.0 9.6  Total Protein 6.5 - 8.1 g/dL - 7.9 7.2  Total Bilirubin 0.3 - 1.2 mg/dL - 0.4 0.5  Alkaline Phos 38 - 126 U/L - 63 -  AST 15 - 41 U/L - 15 12  ALT 0 - 44 U/L - 13 9    Lab Results  Component Value Date   WBC 8.5 10/27/2019   HGB 13.9 12/16/2019   HCT 41.0 12/16/2019   MCV 93.6 10/27/2019   PLT 282 10/27/2019   NEUTROABS 4.2 10/23/2019    ASSESSMENT & PLAN:  BRCA2 gene mutation positive Average to risk of cancer by age of 82: (Antoniou et al pooled pedigree data from 82 studies of 8139 index patients with breast or ovarian cancer) Breast cancer risk: 45 percent (95% CI, 33 to 54 percent) Ovarian cancer risk: 11 percent (95% CI, 4.1 to 18 percent)   Other cancer risks:  1. Pancreatic cancer (relative risk is 3.51) with incidence of 4.9% in BRCA2 carriers 2. Colorectal cancers: In many studies there was no increased risk But there may be some for BRCA1 carriers 3. Melanoma and other skin cancers: The risk of oatmeal melanoma is increase in BRCA2 mutation  carriers but it still extremely rare. ------------------------------------------------------------------------------------------------------------------ Breast cancer surveillance: 1.  Breast exam 09/27/21: Benign (done by her primary care) 2. Mammogram 09/27/21:results Pending 3.  Breast MRI 03/15/21: Unremarkable Breast density category B   She discontinued her medications for rheumatoid arthritis which were Plaquenil and prednisone.  She is doing quite well without any major joint symptoms.  She feels little stiff in the morning but that gets better with activity.   The patient underwent bilateral salpingo-oophorectomy and hysterectomy November 2020 for ovarian cancer risk reduction. Return to clinic in 1 year for follow-up    No orders of the defined types were placed in this  encounter.  The patient has a good understanding of the overall plan. she agrees with it. she will call with any problems that may develop before the next visit here.  Total time spent: 20 mins including face to face time and time spent for planning, charting and coordination of care  Rulon Eisenmenger, MD, MPH 09/27/2021  I, Thana Ates, am acting as scribe for Dr. Nicholas Lose.  I have reviewed the above documentation for accuracy and completeness, and I agree with the above.

## 2021-09-27 ENCOUNTER — Other Ambulatory Visit: Payer: Self-pay

## 2021-09-27 ENCOUNTER — Inpatient Hospital Stay: Payer: Managed Care, Other (non HMO) | Attending: Hematology and Oncology | Admitting: Hematology and Oncology

## 2021-09-27 ENCOUNTER — Ambulatory Visit
Admission: RE | Admit: 2021-09-27 | Discharge: 2021-09-27 | Disposition: A | Discharge status: Home / Self Care | Payer: Managed Care, Other (non HMO) | Source: Ambulatory Visit | Attending: Obstetrics and Gynecology | Admitting: Obstetrics and Gynecology

## 2021-09-27 ENCOUNTER — Encounter: Payer: Self-pay | Admitting: Obstetrics and Gynecology

## 2021-09-27 ENCOUNTER — Ambulatory Visit (INDEPENDENT_AMBULATORY_CARE_PROVIDER_SITE_OTHER): Payer: Managed Care, Other (non HMO) | Admitting: Obstetrics and Gynecology

## 2021-09-27 VITALS — BP 132/80 | HR 87 | Ht 68.0 in | Wt 193.0 lb

## 2021-09-27 DIAGNOSIS — Z8041 Family history of malignant neoplasm of ovary: Secondary | ICD-10-CM

## 2021-09-27 DIAGNOSIS — Z01419 Encounter for gynecological examination (general) (routine) without abnormal findings: Secondary | ICD-10-CM | POA: Diagnosis not present

## 2021-09-27 DIAGNOSIS — Z1501 Genetic susceptibility to malignant neoplasm of breast: Secondary | ICD-10-CM

## 2021-09-27 DIAGNOSIS — Z1231 Encounter for screening mammogram for malignant neoplasm of breast: Secondary | ICD-10-CM

## 2021-09-27 DIAGNOSIS — Z9079 Acquired absence of other genital organ(s): Secondary | ICD-10-CM

## 2021-09-27 DIAGNOSIS — Z1211 Encounter for screening for malignant neoplasm of colon: Secondary | ICD-10-CM | POA: Diagnosis not present

## 2021-09-27 DIAGNOSIS — Z90722 Acquired absence of ovaries, bilateral: Secondary | ICD-10-CM | POA: Diagnosis not present

## 2021-09-27 DIAGNOSIS — Z1509 Genetic susceptibility to other malignant neoplasm: Secondary | ICD-10-CM

## 2021-09-27 DIAGNOSIS — Z90710 Acquired absence of both cervix and uterus: Secondary | ICD-10-CM | POA: Diagnosis not present

## 2021-09-27 DIAGNOSIS — Z0000 Encounter for general adult medical examination without abnormal findings: Secondary | ICD-10-CM

## 2021-09-27 DIAGNOSIS — M1990 Unspecified osteoarthritis, unspecified site: Secondary | ICD-10-CM | POA: Insufficient documentation

## 2021-09-27 IMAGING — MG MM DIGITAL SCREENING BILAT W/ TOMO AND CAD
8 series · 8 of 24 positions shown · non-contrast
Comparison: Previous exam(s).

CLINICAL DATA: Screening.

EXAM:
DIGITAL SCREENING BILATERAL MAMMOGRAM WITH TOMOSYNTHESIS AND CAD
TECHNIQUE: Bilateral screening digital craniocaudal and mediolateral oblique
mammograms were obtained. Bilateral screening digital breast
tomosynthesis was performed. The images were evaluated with
computer-aided detection.

[L MLO synth-2D]
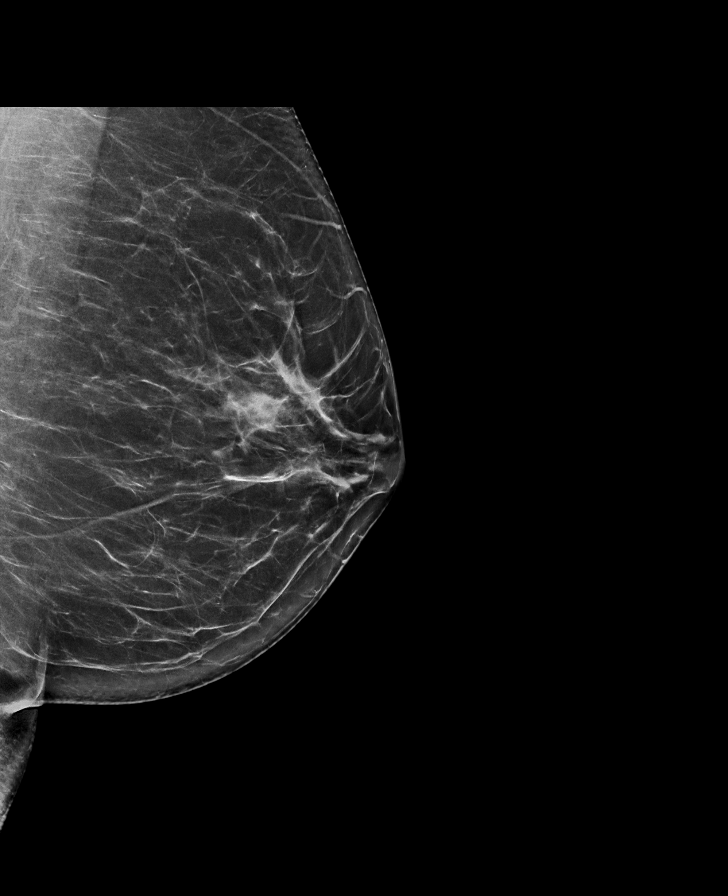

[R MLO synth-2D]
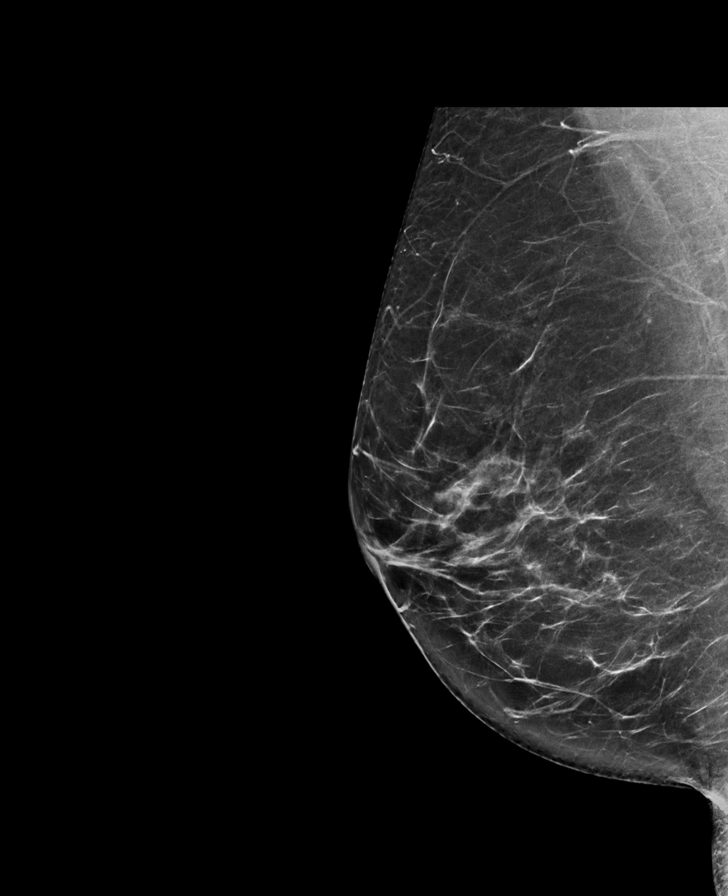

[R CC synth-2D]
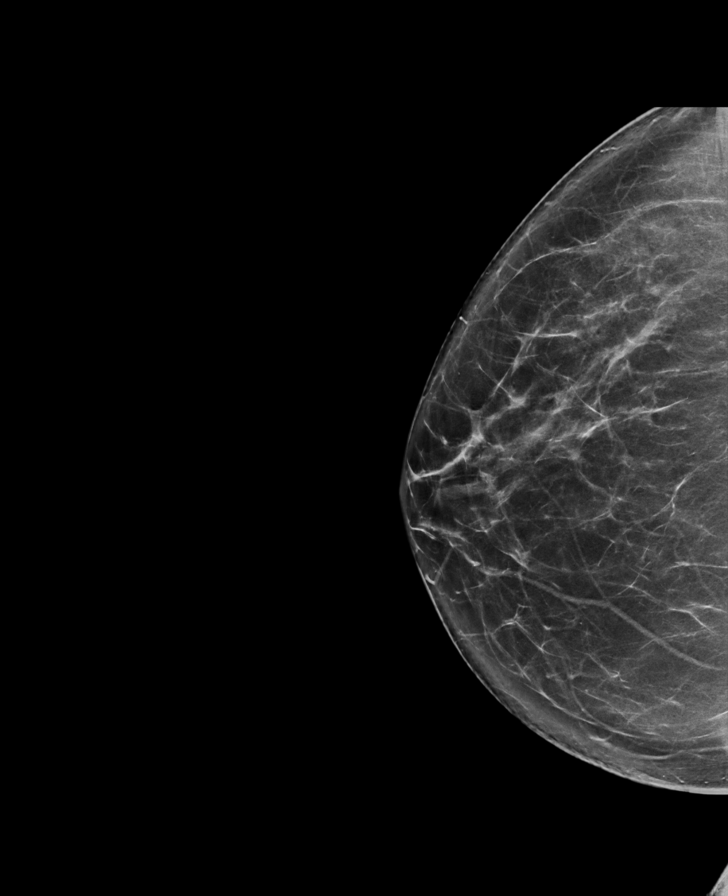

[L CC synth-2D]
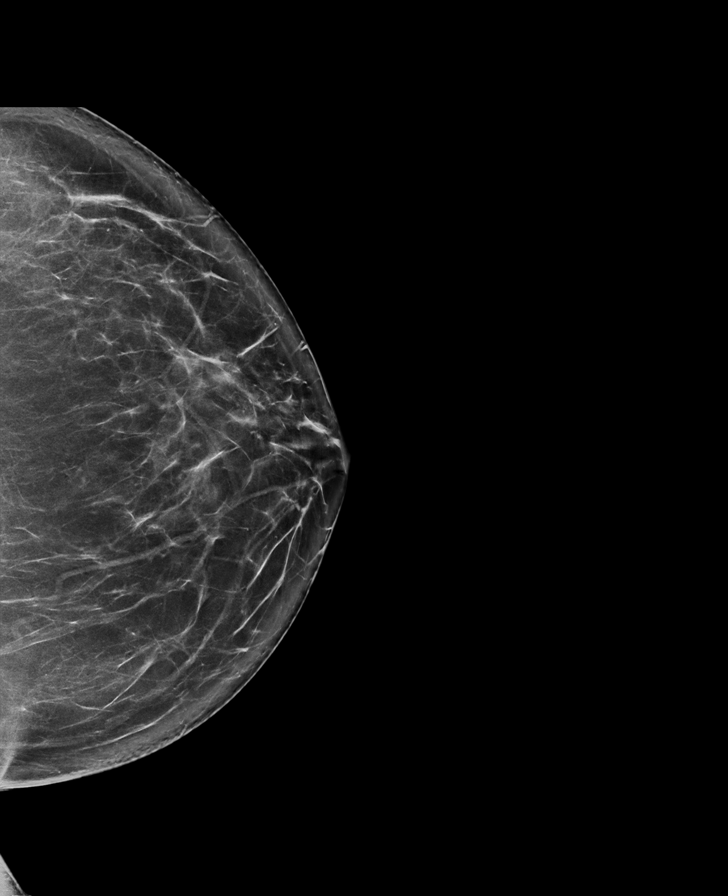

[L CC tomo · tomo slice 41/80.0]
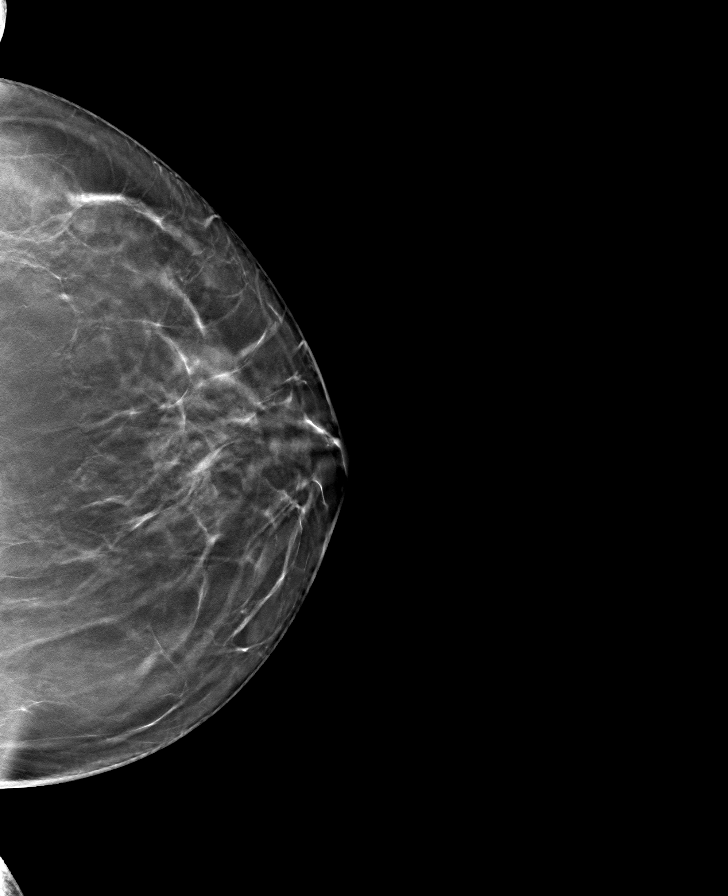

[R CC tomo · tomo slice 41/81.0]
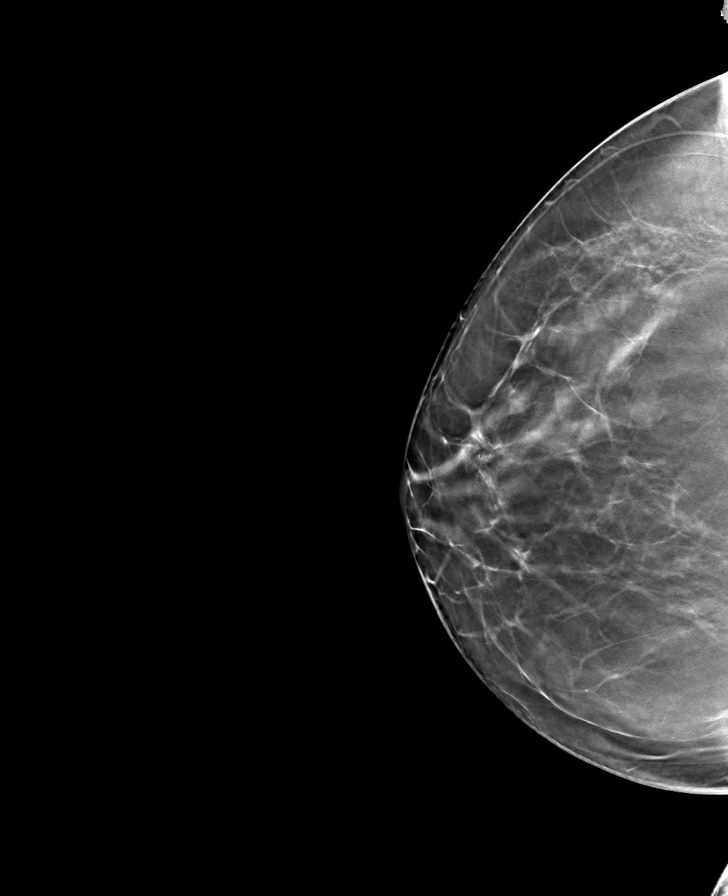

[L MLO tomo · tomo slice 40/79.0]
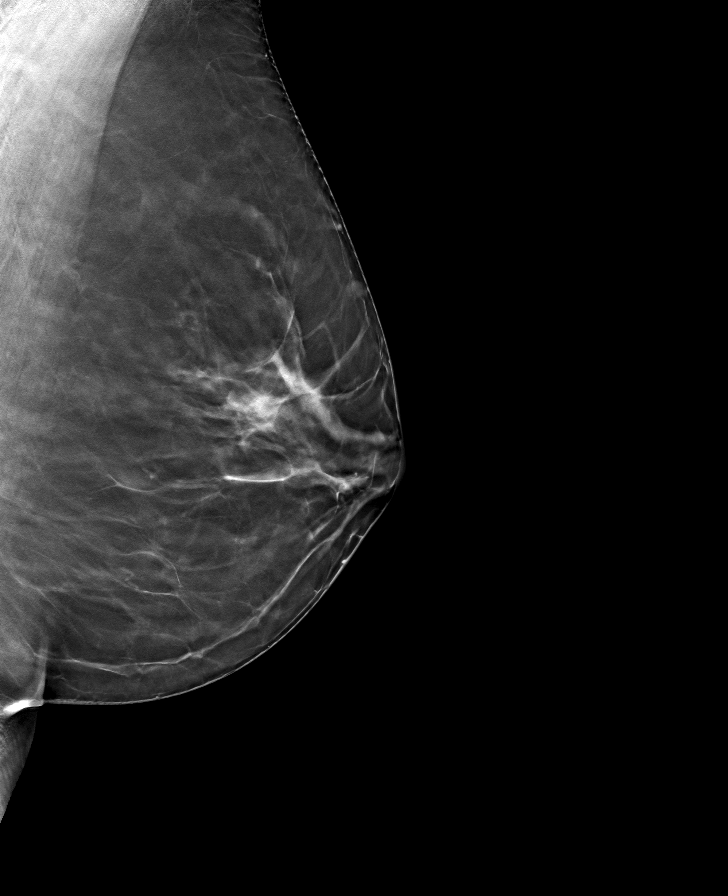

[R MLO tomo · tomo slice 41/80.0]
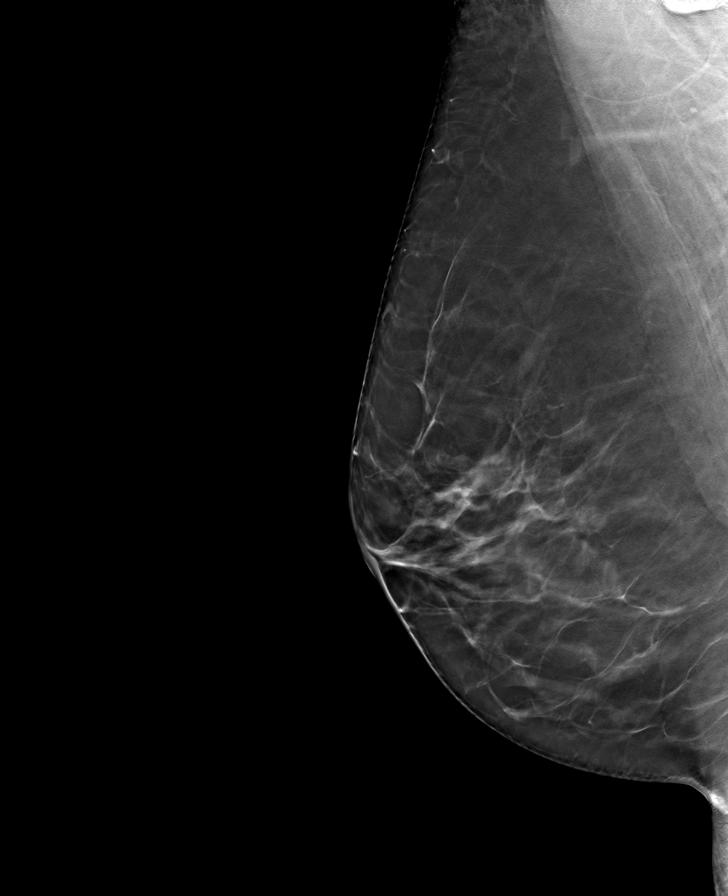

[8 of 24 positions shown; findings below may reference images not displayed]

ACR Breast Density Category b: There are scattered areas of
fibroglandular density.
FINDINGS: There are no findings suspicious for malignancy.
IMPRESSION: No mammographic evidence of malignancy. A result letter of this
screening mammogram will be mailed directly to the patient.

RECOMMENDATION:
Screening mammogram in one year. (Code:[BY])

BI-RADS CATEGORY  1: Negative.

## 2021-09-27 NOTE — Assessment & Plan Note (Signed)
Average to risk of cancer by age of 18: (Antoniou et al pooled pedigree data from 36 studies of 8139 index patients with breast or ovarian cancer) Breast cancer risk: 45 percent (95% CI, 33 to 54 percent) Ovarian cancer risk: 11 percent (95% CI, 4.1 to 18 percent)  Other cancer risks:  1. Pancreatic cancer (relative risk is 3.51) with incidence of 4.9% in BRCA2 carriers 2. Colorectal cancers: In many studies there was no increased risk But there may be some for BRCA1 carriers 3. Melanoma and other skin cancers: The risk of oatmeal melanoma is increase in BRCA2 mutation carriers but it still extremely rare. ------------------------------------------------------------------------------------------------------------------ Breast cancer surveillance: 1.  Breast exam 09/27/21: Benign 2. Mammogram 09/27/21:results Pending 3.  Breast MRI 03/15/21: Unremarkable Breast density category B  Recent diagnosis of rheumatoid arthritis: Currently on hydroxychloroquine.  The patient underwent bilateral salpingo-oophorectomy and hysterectomy November 2020 for ovarian cancer risk reduction Return to clinic in 1 year for follow-up

## 2021-09-27 NOTE — Patient Instructions (Signed)

## 2021-09-28 LAB — COMPREHENSIVE METABOLIC PANEL
AG Ratio: 1.7 (calc) (ref 1.0–2.5)
ALT: 20 U/L (ref 6–29)
AST: 17 U/L (ref 10–35)
Albumin: 4.8 g/dL (ref 3.6–5.1)
Alkaline phosphatase (APISO): 93 U/L (ref 31–125)
BUN: 17 mg/dL (ref 7–25)
CO2: 29 mmol/L (ref 20–32)
Calcium: 9.8 mg/dL (ref 8.6–10.2)
Chloride: 103 mmol/L (ref 98–110)
Creat: 0.77 mg/dL (ref 0.50–0.99)
Globulin: 2.9 g/dL (calc) (ref 1.9–3.7)
Glucose, Bld: 92 mg/dL (ref 65–99)
Potassium: 3.8 mmol/L (ref 3.5–5.3)
Sodium: 140 mmol/L (ref 135–146)
Total Bilirubin: 0.4 mg/dL (ref 0.2–1.2)
Total Protein: 7.7 g/dL (ref 6.1–8.1)

## 2021-09-28 LAB — CBC
HCT: 42.6 % (ref 35.0–45.0)
Hemoglobin: 14.3 g/dL (ref 11.7–15.5)
MCH: 31.4 pg (ref 27.0–33.0)
MCHC: 33.6 g/dL (ref 32.0–36.0)
MCV: 93.4 fL (ref 80.0–100.0)
MPV: 10.7 fL (ref 7.5–12.5)
Platelets: 281 10*3/uL (ref 140–400)
RBC: 4.56 10*6/uL (ref 3.80–5.10)
RDW: 13.1 % (ref 11.0–15.0)
WBC: 4.7 10*3/uL (ref 3.8–10.8)

## 2021-09-28 LAB — LIPID PANEL
Cholesterol: 180 mg/dL (ref <200)
HDL: 55 mg/dL (ref >=50)
LDL Cholesterol (Calc): 100 mg/dL (calc) — ABNORMAL HIGH
Non-HDL Cholesterol (Calc): 125 mg/dL (calc) (ref <130)
Total CHOL/HDL Ratio: 3.3 (calc) (ref <5.0)
Triglycerides: 153 mg/dL — ABNORMAL HIGH (ref <150)

## 2022-02-07 DIAGNOSIS — Z842 Family history of other diseases of the genitourinary system: Secondary | ICD-10-CM | POA: Insufficient documentation

## 2022-02-28 ENCOUNTER — Other Ambulatory Visit: Payer: Self-pay

## 2022-02-28 ENCOUNTER — Ambulatory Visit (AMBULATORY_SURGERY_CENTER): Payer: Managed Care, Other (non HMO)

## 2022-02-28 VITALS — Ht 68.0 in | Wt 185.0 lb

## 2022-02-28 DIAGNOSIS — Z1211 Encounter for screening for malignant neoplasm of colon: Secondary | ICD-10-CM

## 2022-02-28 MED ORDER — NA SULFATE-K SULFATE-MG SULF 17.5-3.13-1.6 GM/177ML PO SOLN: 1.0000 | Freq: Once | ORAL | 0 refills | Status: AC

## 2022-02-28 NOTE — Progress Notes (Signed)
No egg or soy allergy known to patient  °No issues known to pt with past sedation with any surgeries or procedures °Patient denies ever being told they had issues or difficulty with intubation  °No FH of Malignant Hyperthermia °Pt is not on diet pills °Pt is not on home 02  °Pt is not on blood thinners  °Pt denies issues with constipation  °No A fib or A flutter °Pt is fully vaccinated for Covid x 2; °NO PA's for preps discussed with pt in PV today  °Discussed with pt there will be an out-of-pocket cost for prep and that varies from $0 to 70 + dollars - pt verbalized understanding  °Due to the COVID-19 pandemic we are asking patients to follow certain guidelines in PV and the [facility]   °Pt aware of COVID protocols and [facility] guidelines  °PV completed over the phone. Pt verified name, DOB, address and insurance during PV today.  °Pt mailed instruction packet with copy of consent form to read and not return, and instructions.  °Pt encouraged to call with questions or issues.  °If pt has My chart, procedure instructions sent via My Chart  ° °

## 2022-03-02 ENCOUNTER — Encounter: Payer: Self-pay | Admitting: Gastroenterology

## 2022-03-12 ENCOUNTER — Other Ambulatory Visit: Payer: Self-pay | Admitting: Hematology and Oncology

## 2022-03-12 ENCOUNTER — Telehealth (HOSPITAL_BASED_OUTPATIENT_CLINIC_OR_DEPARTMENT_OTHER): Payer: Self-pay | Admitting: Adult Health

## 2022-03-12 ENCOUNTER — Ambulatory Visit
Admission: RE | Admit: 2022-03-12 | Discharge: 2022-03-12 | Disposition: A | Discharge status: Home / Self Care | Payer: Managed Care, Other (non HMO) | Source: Ambulatory Visit | Attending: Hematology and Oncology | Admitting: Hematology and Oncology

## 2022-03-12 DIAGNOSIS — R9389 Abnormal findings on diagnostic imaging of other specified body structures: Secondary | ICD-10-CM

## 2022-03-12 DIAGNOSIS — Z1501 Genetic susceptibility to malignant neoplasm of breast: Secondary | ICD-10-CM

## 2022-03-12 IMAGING — MR MR BREAST BILAT WO/W CM
8 of 13 series · 33 of 48 positions shown · IV contrast (9 ml gadavist)
Comparison: [DATE] and prior breast MRs. [DATE] and prior
mammograms.

CLINICAL DATA: 50-year-old female with high lifetime risk for
developing breast cancer and BRCA 2 gene mutation, for screening
breast MRI.

EXAM:
BILATERAL BREAST MRI WITH AND WITHOUT CONTRAST
TECHNIQUE: Multiplanar, multisequence MR images of both breasts were obtained
prior to and following the intravenous administration of 9 ml of
Gadavist

[Series 2: t2_tirm_tra ipat (a-p) · axial · 3.0mm · 0.70mm/px · 1 of 65 slices shown]
[im 1/65]
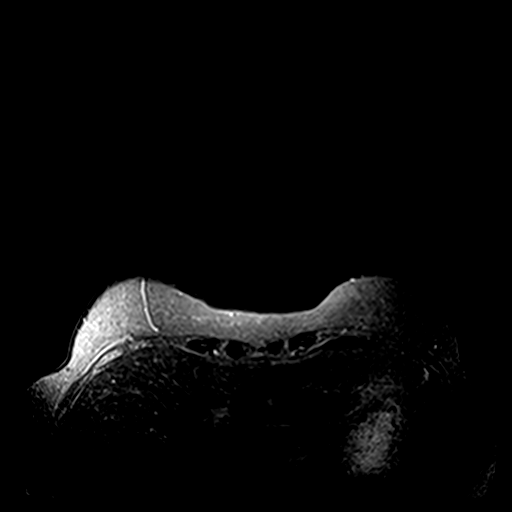

[Series 3: fl3d pre-cm non · axial · non-contrast · 1.2mm · 0.94mm/px · z∈[-93,+117]mm · 5 of 176 slices shown]
[im 1/176]
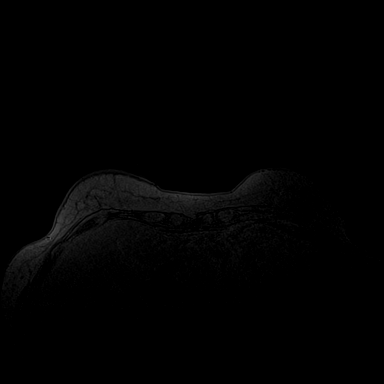
[im 44/176]
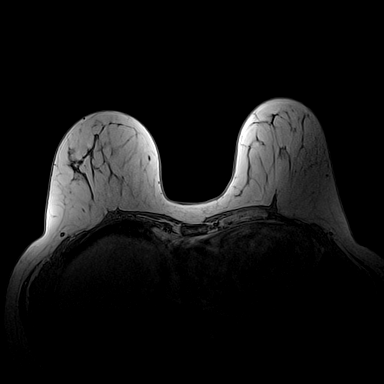
[im 88/176]
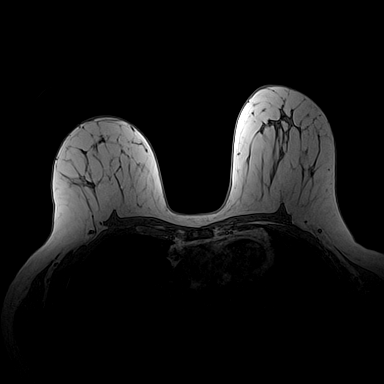
[im 132/176]
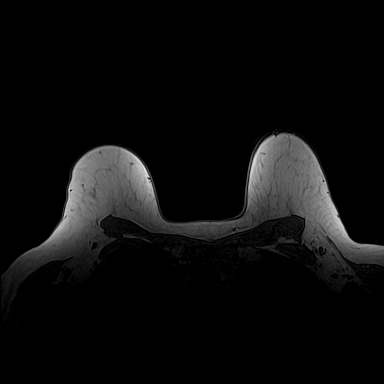
[im 176/176]
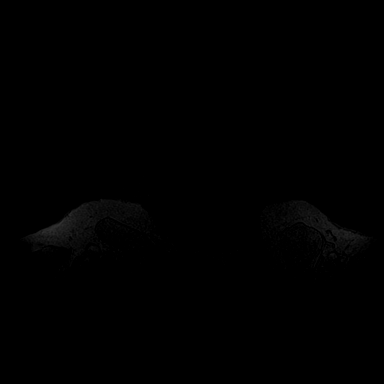

[Series 4: fl3d pre-cm · axial · non-contrast · 1.2mm · 0.94mm/px · z∈[-93,+117]mm · 5 of 176 slices shown]
[im 1/176]
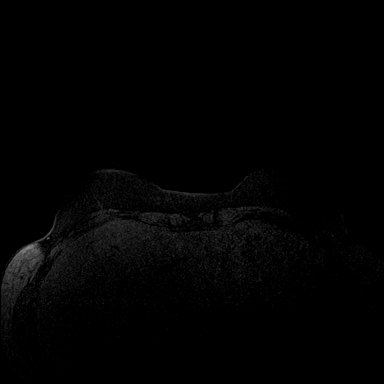
[im 44/176]
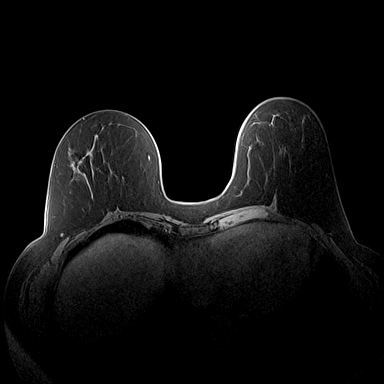
[im 88/176]
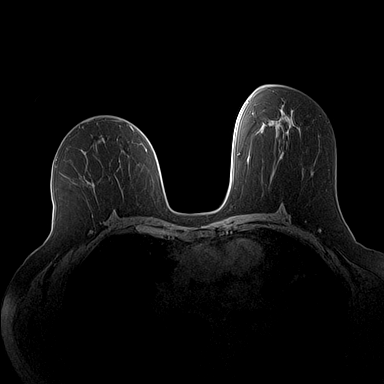
[im 132/176]
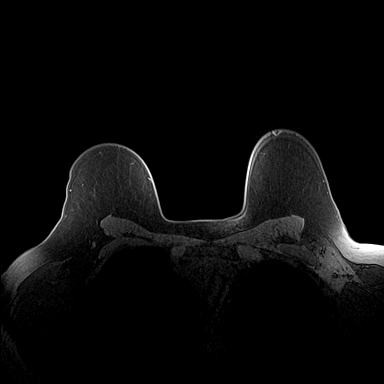
[im 176/176]
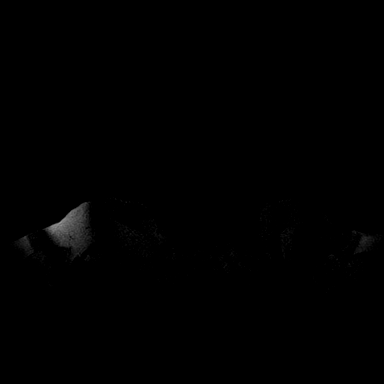

[Series 5: fl3d pre-cm 20 · axial · non-contrast · 1.2mm · 0.94mm/px · z∈[-93,+117]mm · 5 of 176 slices shown (1 of 3)]
[im 1/176]
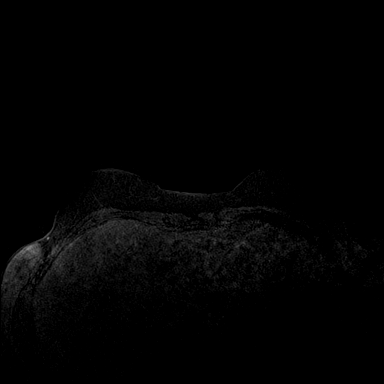
[im 44/176]
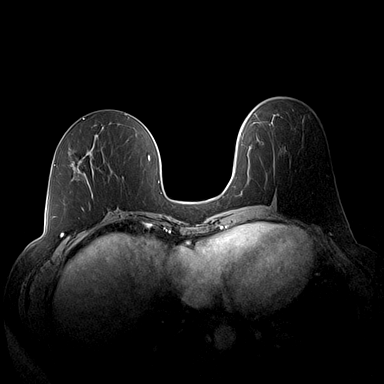
[im 88/176]
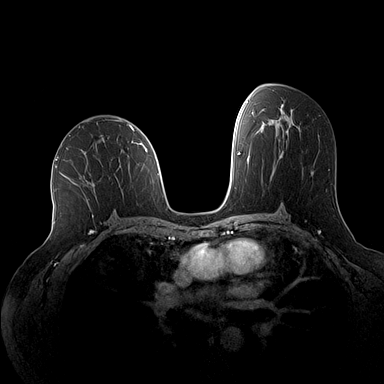
[im 132/176]
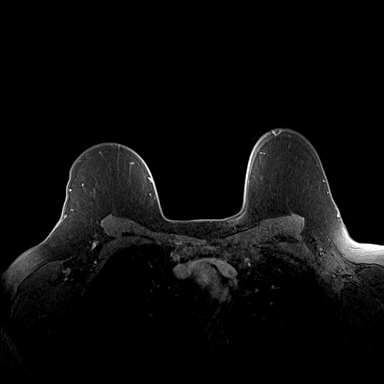
[im 176/176]
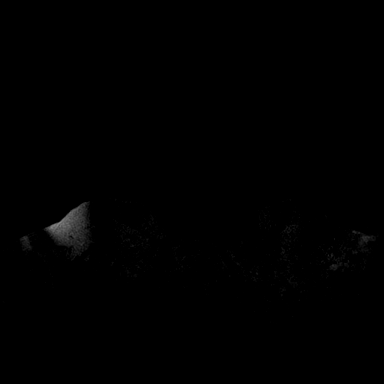

[Series 6: fl3d pre-cm 20 · axial · non-contrast · 1.2mm · 0.94mm/px · z∈[-93,+117]mm · 5 of 176 slices shown (2 of 3)]
[im 1/176]
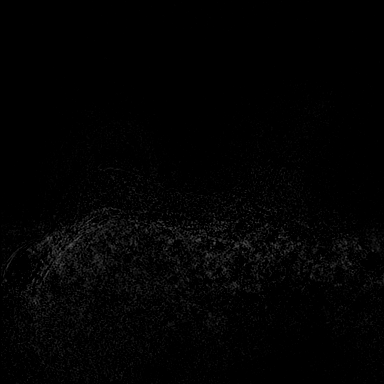
[im 44/176]
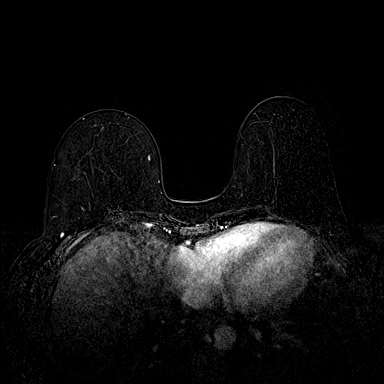
[im 88/176]
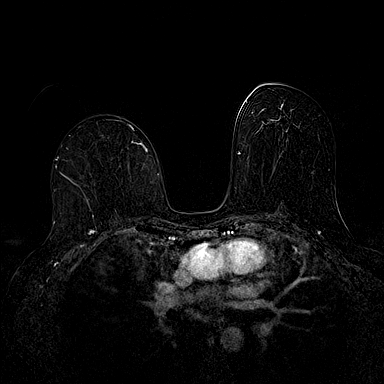
[im 132/176]
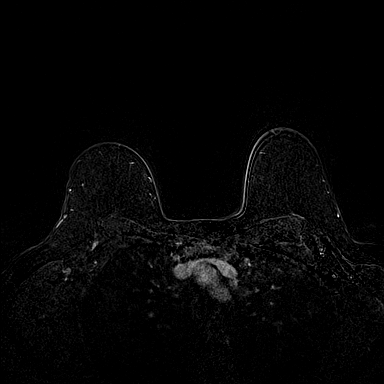
[im 176/176]
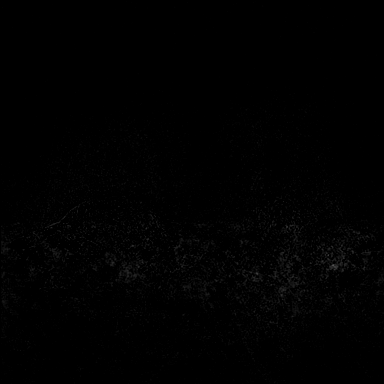

[Series 7: fl3d pre-cm 20 · axial · non-contrast · 211.2mm · 0.94mm/px · 1 of 1 slices shown (3 of 3)]
[im 1/1]
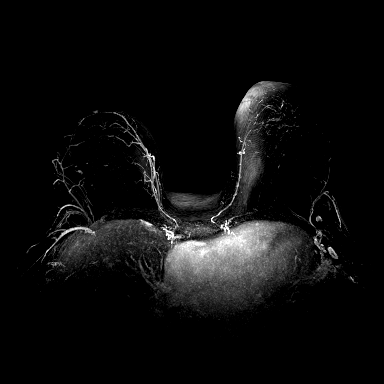

[Series 8: fl3d pre-cm 3min · axial · non-contrast · 1.2mm · 0.94mm/px · z∈[-93,+117]mm · 5 of 176 slices shown]
[im 1/176]
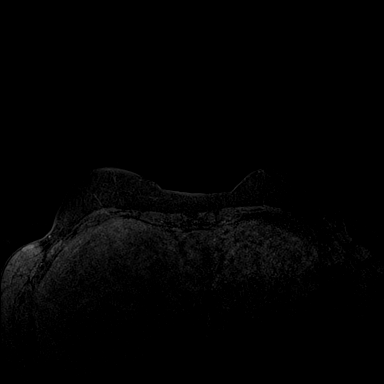
[im 44/176]
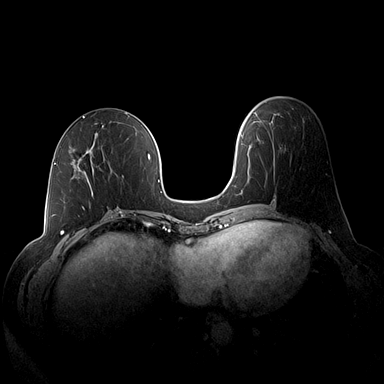
[im 88/176]
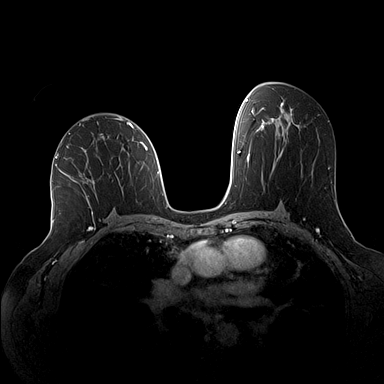
[im 132/176]
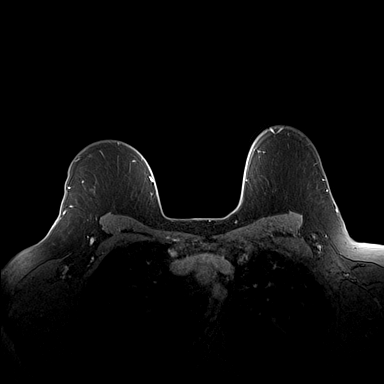
[im 176/176]
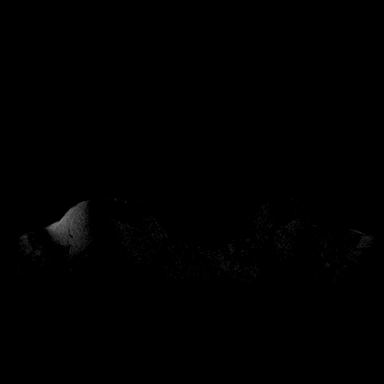

[Series 9: fl3d pre-cm 3min_sub · axial · non-contrast · 1.2mm · 0.94mm/px · z∈[-93,+117]mm · 6 of 176 slices shown]
[im 1/176]
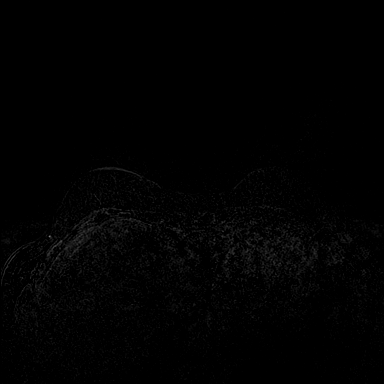
[im 36/176]
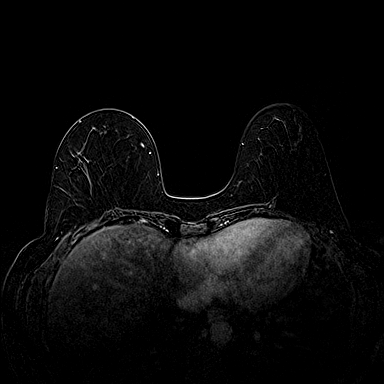
[im 71/176]
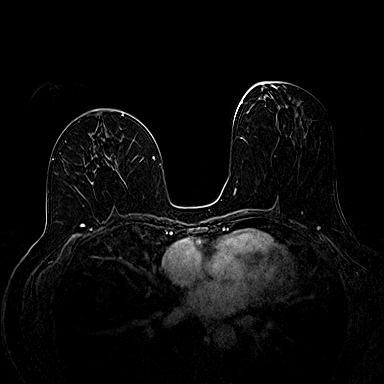
[im 106/176]
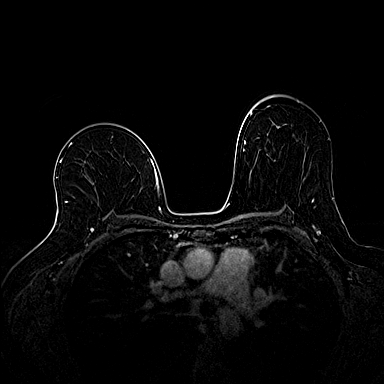
[im 141/176]
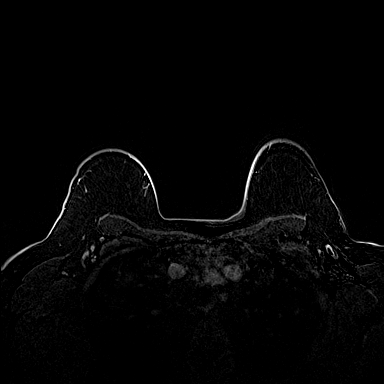
[im 176/176]
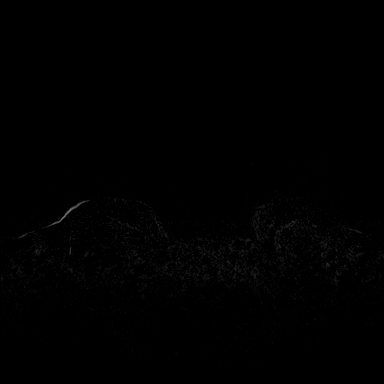

[33 of 48 positions shown; findings below may reference images not displayed]

Three-dimensional MR images were rendered by post-processing of the
original MR data on an independent workstation. The
three-dimensional MR images were interpreted, and findings are
reported in the following complete MRI report for this study. Three
dimensional images were evaluated at the independent interpreting
workstation using the DynaCAD thin client.
FINDINGS: Breast composition: b. Scattered fibroglandular tissue.

Background parenchymal enhancement: Minimal

Right breast: A new 0.7 cm mass within the OUTER central RIGHT
breast is noted (series 6: Image 122). No other suspicious mass or
worrisome enhancement is noted.

Left breast: No mass or abnormal enhancement.

Lymph nodes: No abnormal appearing lymph nodes.

Ancillary findings:  None.
IMPRESSION: 1. New indeterminate 0.7 cm OUTER central RIGHT breast mass. It
would be unlikely to definitively identify this mass sonographically
and MRI guided biopsy is recommended.
2. No other new or suspicious abnormalities within either breast.
3. No abnormal appearing lymph nodes.

RECOMMENDATION:
MR guided RIGHT breast biopsy.

BI-RADS CATEGORY  4: Suspicious.

## 2022-03-12 MED ORDER — GADOBUTROL 1 MMOL/ML IV SOLN
9.0000 mL | Freq: Once | INTRAVENOUS | Status: AC | PRN
  Administered 2022-03-12: 9 mL via INTRAVENOUS

## 2022-03-12 NOTE — Telephone Encounter (Signed)
Called and left patient message to discuss MRI results which show an indeterminate breast lesion that will need MR guided biopsy.  I did not leave her results on her voicemail.  I did ask for her to return my call. ? ?Lillard Anes, NP 03/12/22 3:16 PM ?Medical Oncology and Hematology ?[facility] ?[address] ?[place], [place] [address] ?Tel. [phone]    Fax. [phone] ? ?

## 2022-03-14 ENCOUNTER — Encounter: Payer: Self-pay | Admitting: Gastroenterology

## 2022-03-14 ENCOUNTER — Ambulatory Visit (AMBULATORY_SURGERY_CENTER): Payer: Managed Care, Other (non HMO) | Admitting: Gastroenterology

## 2022-03-14 ENCOUNTER — Other Ambulatory Visit: Payer: Managed Care, Other (non HMO)

## 2022-03-14 VITALS — BP 140/82 | HR 71 | Temp 97.8°F | Resp 12 | Ht 68.0 in | Wt 185.0 lb

## 2022-03-14 DIAGNOSIS — Z1211 Encounter for screening for malignant neoplasm of colon: Secondary | ICD-10-CM | POA: Diagnosis present

## 2022-03-14 MED ORDER — SODIUM CHLORIDE 0.9 % IV SOLN: 500.0000 mL | Freq: Once | INTRAVENOUS | Status: DC

## 2022-03-14 NOTE — Progress Notes (Signed)
HPI: ?This is a woman at routine risk for colon cancer ? ? ?ROS: complete GI ROS as described in HPI, all other review negative. ? ?Constitutional:  No unintentional weight loss ? ? ?Past Medical History:  ?Diagnosis Date  ? Asthma   ? per pt triggers are laughing, cold drink, exercise  ? Cold induced bronchospasm   ? Family history of BRCA2 gene positive   ? Family history of breast cancer   ? Family history of ovarian cancer   ? History of abnormal cervical Pap smear 07/2015  ? Inflammatory arthritis   ? RIGHT hand  ? Insomnia   ? Restless legs syndrome (RLS)   ? ? ?Past Surgical History:  ?Procedure Laterality Date  ? CYSTOSCOPY N/A 10/27/2019  ? Procedure: CYSTOSCOPY;  Surgeon: Salvadore Dom, MD;  Location: [facility];  Service: Gynecology;  Laterality: N/A;  ? CYSTOSCOPY W/ RETROGRADES Bilateral 12/16/2019  ? Procedure: CYSTOSCOPY WITH RETROGRADE PYELOGRAM;  Surgeon: Alexis Frock, MD;  Location: [facility];  Service: Urology;  Laterality: Bilateral;  ? CYSTOSCOPY WITH BIOPSY N/A 12/16/2019  ? Procedure: CYSTOSCOPY WITH BIOPSY;  Surgeon: Alexis Frock, MD;  Location: [facility];  Service: Urology;  Laterality: N/A;  45 MINS  ? TOTAL LAPAROSCOPIC HYSTERECTOMY WITH BILATERAL SALPINGO OOPHORECTOMY Bilateral 10/27/2019  ? Procedure: TOTAL LAPAROSCOPIC HYSTERECTOMY WITH BILATERAL SALPINGO OOPHORECTOMY;  Surgeon: Salvadore Dom, MD;  Location: [facility];  Service: Gynecology;  Laterality: Bilateral;  Extended recovery bed needed  ? WISDOM TOOTH EXTRACTION    ? ? ?Current Outpatient Medications  ?Medication Sig Dispense Refill  ? gabapentin (NEURONTIN) 100 MG capsule Take 100 mg by mouth at bedtime.    ? albuterol (PROVENTIL HFA;VENTOLIN HFA) 108 (90 Base) MCG/ACT inhaler Inhale 2 puffs, 15 minutes before physical activity 1 Inhaler 2  ? clonazePAM (KLONOPIN) 0.5 MG tablet Take 0.5-1 mg by mouth at bedtime.  (Patient not taking: Reported  on 03/14/2022)    ? ?Current Facility-Administered Medications  ?Medication Dose Route Frequency Provider Last Rate Last Admin  ? 0.9 %  sodium chloride infusion  500 mL Intravenous Once Milus Banister, MD      ? ? ?Allergies as of 03/14/2022  ? (No Known Allergies)  ? ? ?Family History  ?Problem Relation Age of Onset  ? Ovarian cancer Mother   ?     stage 4  ? Cancer Mother   ?     stomach wall  ? BRCA 1/2 Mother   ?     BRCA2 +  ? Asthma Father   ? Cerebral palsy Sister   ? Asthma Maternal Aunt   ? COPD Paternal Aunt   ? Asthma Paternal Aunt   ? Asthma Paternal Aunt   ? Breast cancer Maternal Grandmother   ?     dx younger than 29  ? Cancer Maternal Grandfather   ?     lung cancer  ? Asthma Paternal Grandfather   ? Breast cancer Other   ?     d. 34  ? Colon polyps Neg Hx   ? Colon cancer Neg Hx   ? Esophageal cancer Neg Hx   ? Stomach cancer Neg Hx   ? Rectal cancer Neg Hx   ? ? ?Social History  ? ?Socioeconomic History  ? Marital status: Married  ?  Spouse name: Not on file  ? Number of children: Not on file  ? Years of education: Not on file  ?  Highest education level: Not on file  ?Occupational History  ? Not on file  ?Tobacco Use  ? Smoking status: Never  ? Smokeless tobacco: Never  ?Vaping Use  ? Vaping Use: Never used  ?Substance and Sexual Activity  ? Alcohol use: No  ? Drug use: No  ? Sexual activity: Not Currently  ?  Partners: Male  ?  Birth control/protection: Surgical  ?Other Topics Concern  ? Not on file  ?Social History Narrative  ? Not on file  ? ?Social Determinants of Health  ? ?Financial Resource Strain: Not on file  ?Food Insecurity: Not on file  ?Transportation Needs: Not on file  ?Physical Activity: Not on file  ?Stress: Not on file  ?Social Connections: Not on file  ?Intimate Partner Violence: Not on file  ? ? ? ?Physical Exam: ?BP (!) 147/105   Pulse 84   Temp 97.8 ?F (36.6 ?C) (Temporal)   Ht 5' 8" (1.727 m)   Wt 185 lb (83.9 kg)   LMP 09/24/2019 (Exact Date)   SpO2 100%   BMI 28.13  kg/m?  ?Constitutional: generally well-appearing ?Psychiatric: alert and oriented x3 ?Lungs: CTA bilaterally ?Heart: no MCR ? ?Assessment and plan: ?51 y.o. female with routine risk for colon cancer ? ?Screening colonoscopy today ? ?Care is appropriate for the ambulatory setting. ? ?Owens Loffler, MD ?[facility] ?03/14/2022, 12:56 PM ? ? ? ?

## 2022-03-14 NOTE — Op Note (Signed)
[place] ?Patient Name: Jasmin Duran ?Procedure Date: 03/14/2022 1:04 PM ?MRN: [id number] ?Endoscopist: Milus Banister , MD ?Age: 51 ?Referring MD:  ?Date of Birth: Jul 26, 1971 ?Gender: Female ?Account #: [id number] ?Procedure:                Colonoscopy ?Indications:              Screening for colorectal malignant neoplasm ?Medicines:                Monitored Anesthesia Care ?Procedure:                Pre-Anesthesia Assessment: ?                          - Prior to the procedure, a History and Physical  ?                          was performed, and patient medications and  ?                          allergies were reviewed. The patient's tolerance of  ?                          previous anesthesia was also reviewed. The risks  ?                          and benefits of the procedure and the sedation  ?                          options and risks were discussed with the patient.  ?                          All questions were answered, and informed consent  ?                          was obtained. Prior Anticoagulants: The patient has  ?                          taken no previous anticoagulant or antiplatelet  ?                          agents. ASA Grade Assessment: II - A patient with  ?                          mild systemic disease. After reviewing the risks  ?                          and benefits, the patient was deemed in  ?                          satisfactory condition to undergo the procedure. ?                          After obtaining informed consent, the colonoscope  ?  was passed under direct vision. Throughout the  ?                          procedure, the patient's blood pressure, pulse, and  ?                          oxygen saturations were monitored continuously. The  ?                          Colonoscope was introduced through the anus and  ?                          advanced to the the cecum, identified by  ?                          appendiceal orifice  and ileocecal valve. The  ?                          colonoscopy was performed without difficulty. The  ?                          patient tolerated the procedure well. The quality  ?                          of the bowel preparation was good. The ileocecal  ?                          valve, appendiceal orifice, and rectum were  ?                          photographed. ?Scope In: 1:28:42 PM ?Scope Out: 1:43:21 PM ?Scope Withdrawal Time: 0 hours 9 minutes 36 seconds  ?Total Procedure Duration: 0 hours 14 minutes 39 seconds  ?Findings:                 The entire examined colon appeared normal on direct  ?                          and retroflexion views. ?Complications:            No immediate complications. Estimated blood loss:  ?                          None. ?Estimated Blood Loss:     Estimated blood loss: none. ?Impression:               - The entire examined colon is normal on direct and  ?                          retroflexion views. ?                          - No polyps or cancers. ?Recommendation:           - Patient has a contact number available for  ?  emergencies. The signs and symptoms of potential  ?                          delayed complications were discussed with the  ?                          patient. Return to normal activities tomorrow.  ?                          Written discharge instructions were provided to the  ?                          patient. ?                          - Resume previous diet. ?                          - Continue present medications. ?                          - Repeat colonoscopy in 10 years for screening. ?Milus Banister, MD ?03/14/2022 1:46:29 PM ?This report has been signed electronically. ?

## 2022-03-14 NOTE — Patient Instructions (Signed)
Resume previous diet and continue present medications ?No repeat colonoscopy for surveillance purposes for 10 years!! ? ? ?YOU HAD AN ENDOSCOPIC PROCEDURE TODAY AT THE [facility]:   Refer to the procedure report that was given to you for any specific questions about what was found during the examination.  If the procedure report does not answer your questions, please call your gastroenterologist to clarify.  If you requested that your care partner not be given the details of your procedure findings, then the procedure report has been included in a sealed envelope for you to review at your convenience later. ? ?YOU SHOULD EXPECT: Some feelings of bloating in the abdomen. Passage of more gas than usual.  Walking can help get rid of the air that was put into your GI tract during the procedure and reduce the bloating. If you had a lower endoscopy (such as a colonoscopy or flexible sigmoidoscopy) you may notice spotting of blood in your stool or on the toilet paper. If you underwent a bowel prep for your procedure, you may not have a normal bowel movement for a few days. ? ?Please Note:  You might notice some irritation and congestion in your nose or some drainage.  This is from the oxygen used during your procedure.  There is no need for concern and it should clear up in a day or so. ? ?SYMPTOMS TO REPORT IMMEDIATELY: ? ?Following lower endoscopy (colonoscopy or flexible sigmoidoscopy): ? Excessive amounts of blood in the stool ? Significant tenderness or worsening of abdominal pains ? Swelling of the abdomen that is new, acute ? Fever of 100?F or higher ? ?For urgent or emergent issues, a gastroenterologist can be reached at any hour by calling [phone]. ?Do not use MyChart messaging for urgent concerns.  ? ? ?DIET:  We do recommend a small meal at first, but then you may proceed to your regular diet.  Drink plenty of fluids but you should avoid alcoholic beverages for 24 hours. ? ?ACTIVITY:  You  should plan to take it easy for the rest of today and you should NOT DRIVE or use heavy machinery until tomorrow (because of the sedation medicines used during the test).   ? ?FOLLOW UP: ?Our staff will call the number listed on your records 48-72 hours following your procedure to check on you and address any questions or concerns that you may have regarding the information given to you following your procedure. If we do not reach you, we will leave a message.  We will attempt to reach you two times.  During this call, we will ask if you have developed any symptoms of COVID 19. If you develop any symptoms (ie: fever, flu-like symptoms, shortness of breath, cough etc.) before then, please call [phone].  If you test positive for Covid 19 in the 2 weeks post procedure, please call and report this information to [place].   ? ?If any biopsies were taken you will be contacted by phone or by letter within the next 1-3 weeks.  Please call us at [phone] if you have not heard about the biopsies in 3 weeks.  ? ? ?SIGNATURES/CONFIDENTIALITY: ?You and/or your care partner have signed paperwork which will be entered into your electronic medical record.  These signatures attest to the fact that that the information above on your After Visit Summary has been reviewed and is understood.  Full responsibility of the confidentiality of this discharge information lies with you and/or your care-partner.  ?

## 2022-03-14 NOTE — Progress Notes (Signed)
VS-AS  Pt's states no medical or surgical changes since previsit or office visit.  

## 2022-03-14 NOTE — Progress Notes (Signed)
PT taken to PACU. Monitors in place. VSS. Report given to RN. 

## 2022-03-16 ENCOUNTER — Telehealth: Payer: Self-pay

## 2022-03-16 NOTE — Telephone Encounter (Signed)
?  Follow up Call- ? ? ?  03/14/2022  ? 12:50 PM  ?Call back number  ?Post procedure Call Back phone  # [phone]  ?Permission to leave phone message Yes  ?  ? ?Patient questions: ? ?Do you have a fever, pain , or abdominal swelling? No. ?Pain Score  0 * ? ?Have you tolerated food without any problems? Yes.   ? ?Have you been able to return to your normal activities? Yes.   ? ?Do you have any questions about your discharge instructions: ?Diet   No. ?Medications  No. ?Follow up visit  No. ? ?Do you have questions or concerns about your Care? No. ? ?Actions: ?* If pain score is 4 or above: ?No action needed, pain <4. ? ?Have you developed a fever since your procedure? no ? ?2.   Have you had an respiratory symptoms (SOB or cough) since your procedure? no ? ?3.   Have you tested positive for COVID 19 since your procedure no ? ?4.   Have you had any family members/close contacts diagnosed with the COVID 19 since your procedure?  no ? ? ?If yes to any of these questions please route to Laverna Peace, RN and Karlton Lemon, RN  ? ? ?

## 2022-04-04 ENCOUNTER — Ambulatory Visit
Admission: RE | Admit: 2022-04-04 | Discharge: 2022-04-04 | Disposition: A | Discharge status: Home / Self Care | Payer: Managed Care, Other (non HMO) | Source: Ambulatory Visit | Attending: Hematology and Oncology | Admitting: Hematology and Oncology

## 2022-04-04 ENCOUNTER — Encounter: Payer: Self-pay | Admitting: Hematology and Oncology

## 2022-04-04 DIAGNOSIS — R9389 Abnormal findings on diagnostic imaging of other specified body structures: Secondary | ICD-10-CM

## 2022-04-04 HISTORY — PX: BREAST BIOPSY: SHX20

## 2022-04-04 IMAGING — MR MR BREAST BX W/ LOC DEV 1ST LEASION IMAGE BX SPEC MR GUIDE*R*
6 of 8 series · 32 of 48 positions shown · IV contrast (9 ml gadavist)
Comparison: Previous exams.
COMPARISON: Previous exams.

Addendum:
CLINICAL DATA: Patient presents for MRI guided core needle biopsy
of a small enhancing right breast mass.

EXAM:
MRI GUIDED CORE NEEDLE BIOPSY OF THE RIGHT BREAST
TECHNIQUE: Multiplanar, multisequence MR imaging of the right breast was
performed both before and after administration of intravenous
contrast.
CONTRAST:  8 mL of Gadavist

[Series 2: fiducial unilateral · sagittal · 2.0mm · 1.33mm/px · 1 of 52 slices shown]
[im 1/52]
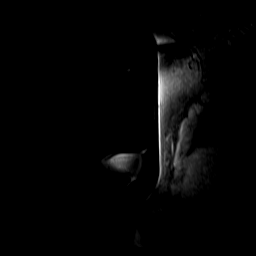

[Series 3: dynamic pre · axial · non-contrast · 1.3mm · 0.73mm/px · z∈[-72,+114]mm · 6 of 144 slices shown]
[im 1/144]
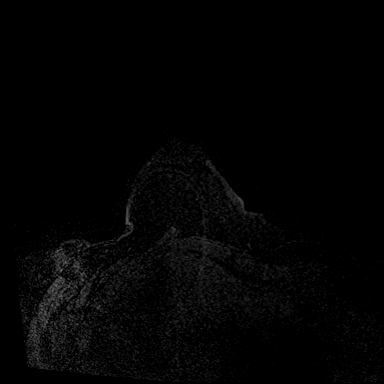
[im 29/144]
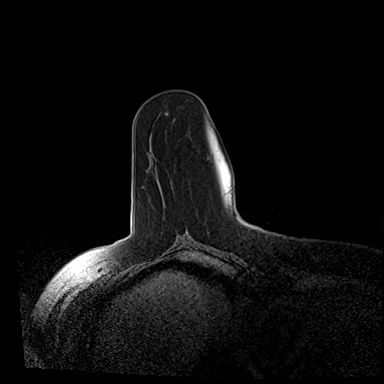
[im 58/144]
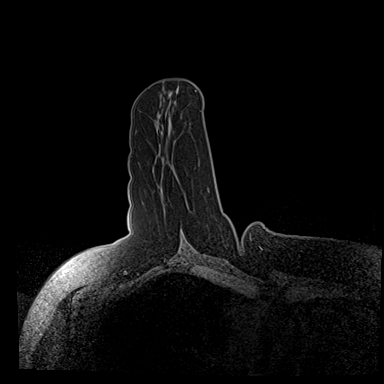
[im 86/144]
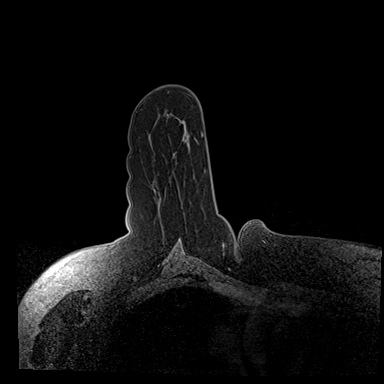
[im 115/144]
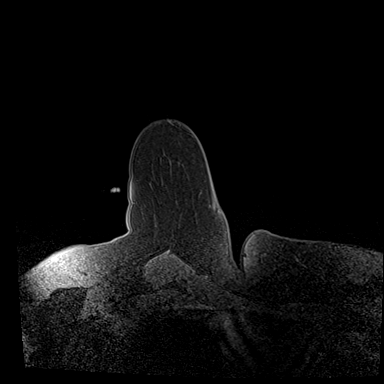
[im 144/144]
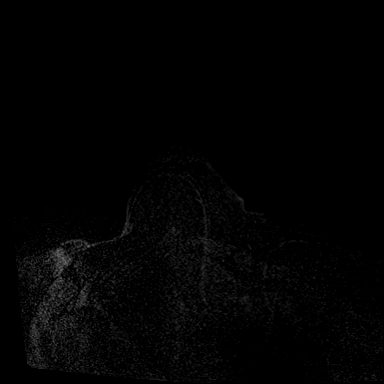

[Series 4: dynamic post 20 · axial · 1.3mm · 0.73mm/px · z∈[-72,+114]mm · 6 of 144 slices shown (1 of 2)]
[im 1/144]
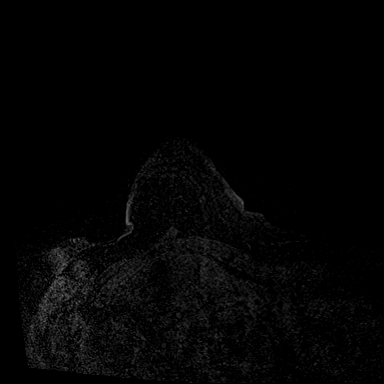
[im 29/144]
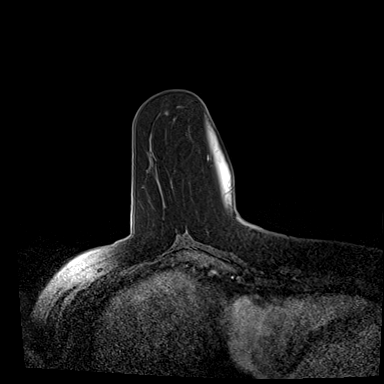
[im 58/144]
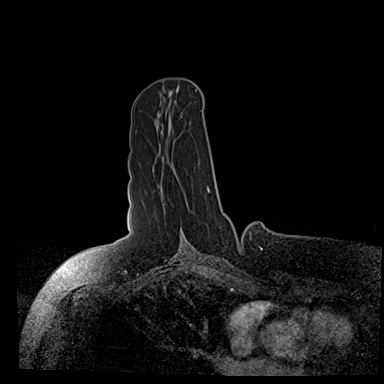
[im 86/144]
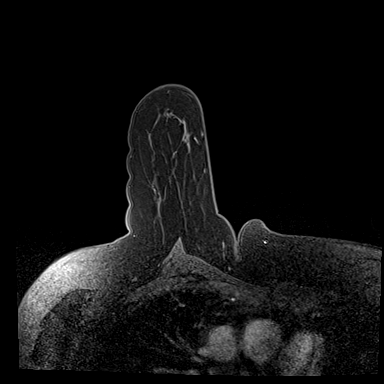
[im 115/144]
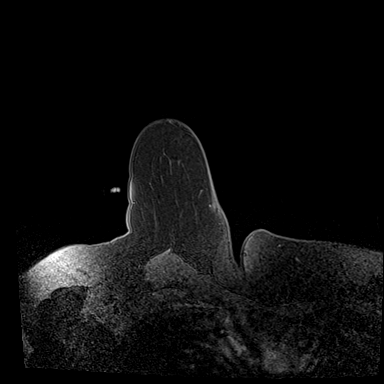
[im 144/144]
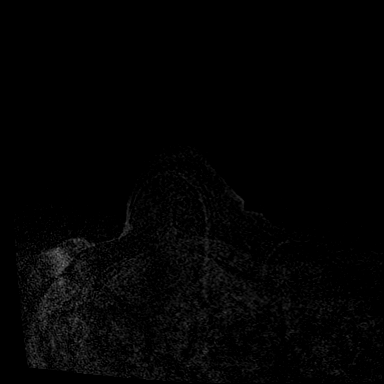

[Series 5: dynamic post 20 · axial · 1.3mm · 0.73mm/px · z∈[-72,+114]mm · 7 of 144 slices shown (2 of 2)]
[im 1/144]
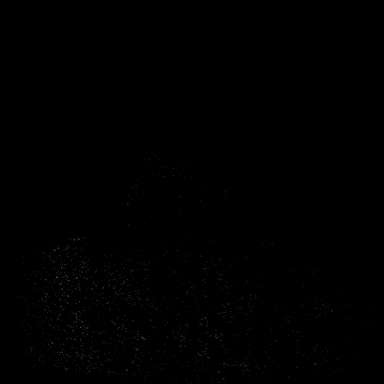
[im 24/144]
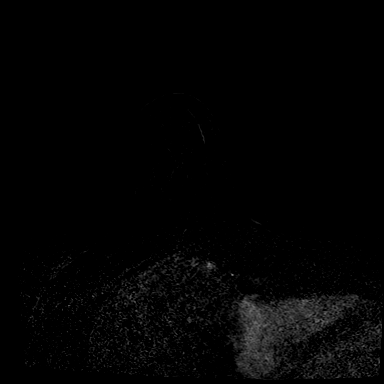
[im 48/144]
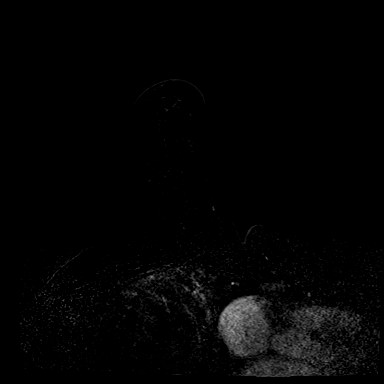
[im 72/144]
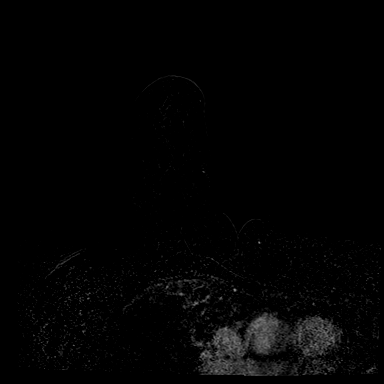
[im 96/144]
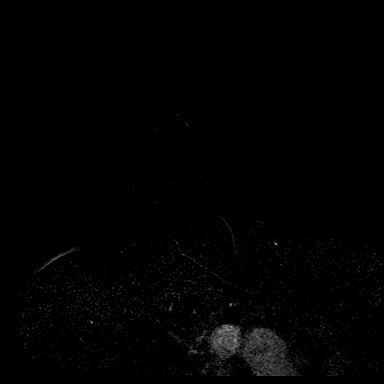
[im 120/144]
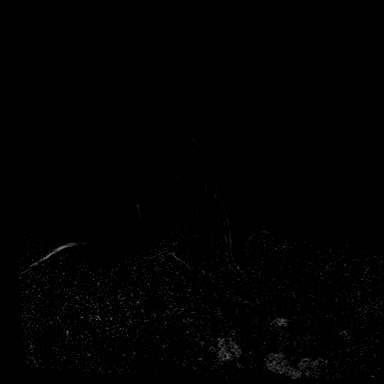
[im 144/144]
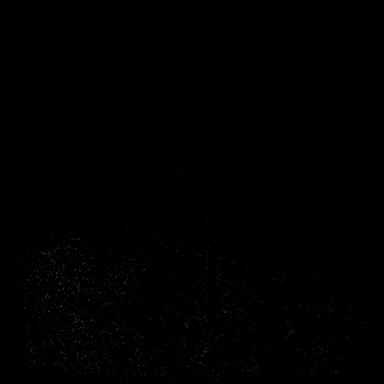

[Series 6: dynamic post 3 · axial · 1.3mm · 0.73mm/px · z∈[-72,+114]mm · 7 of 144 slices shown (1 of 2)]
[im 1/144]
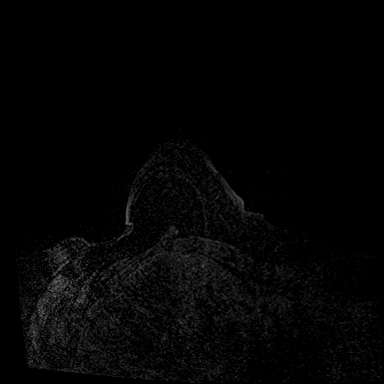
[im 24/144]
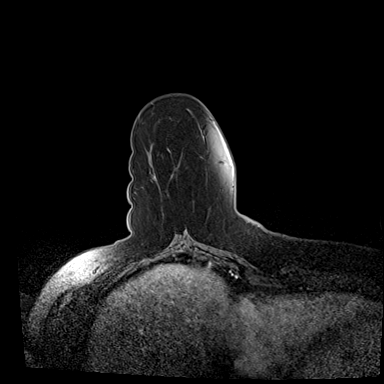
[im 48/144]
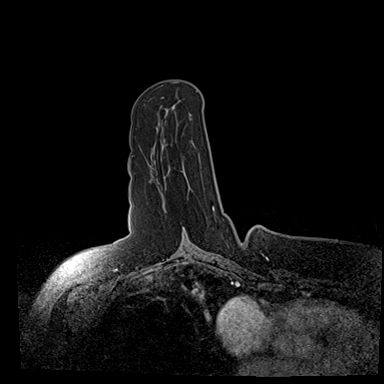
[im 72/144]
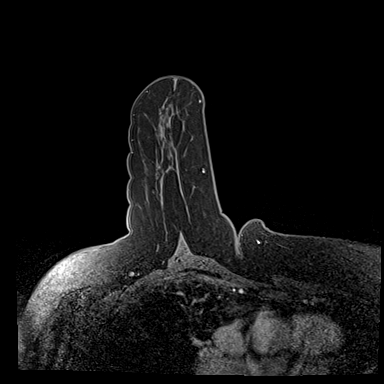
[im 96/144]
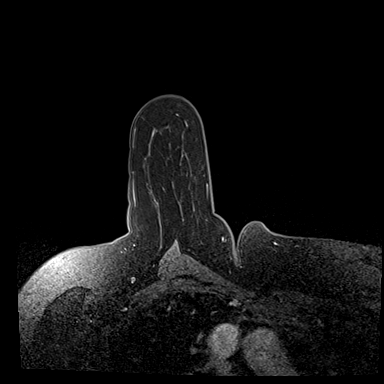
[im 120/144]
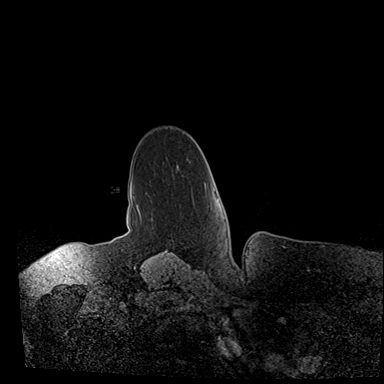
[im 144/144]
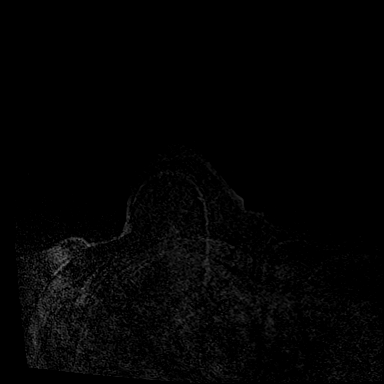

[Series 7: dynamic post 3 · axial · 1.3mm · 0.73mm/px · z∈[-72,+51]mm · 5 of 144 slices shown (2 of 2)]
[im 1/144]
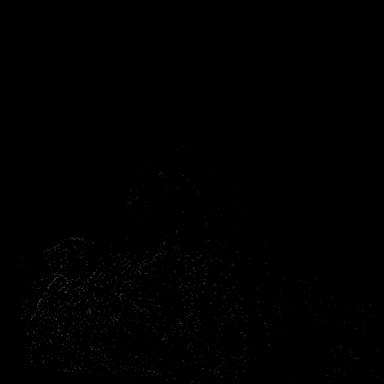
[im 24/144]
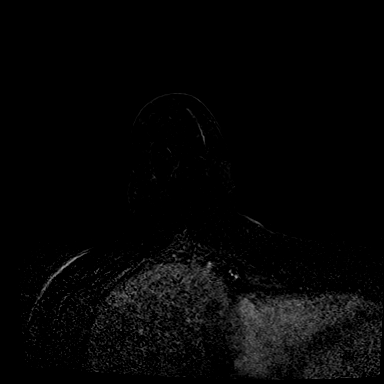
[im 48/144]
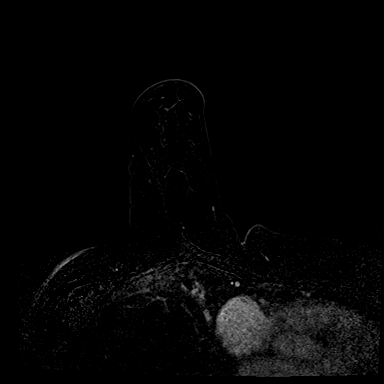
[im 72/144]
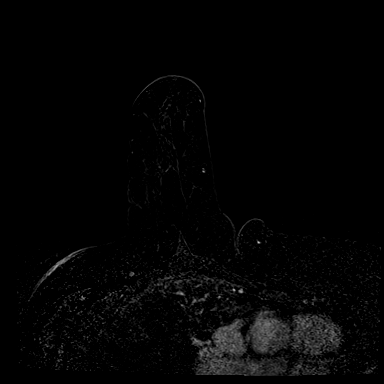
[im 96/144]
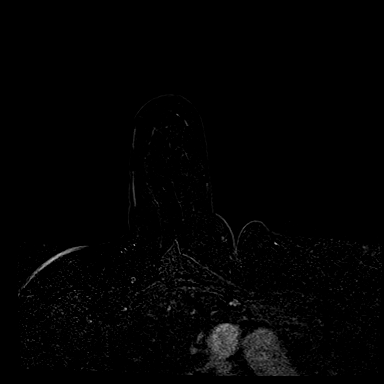

[32 of 48 positions shown; findings below may reference images not displayed]

FINDINGS: I met with the patient, and we discussed the procedure of MRI guided
biopsy, including risks, benefits, and alternatives. Specifically,
we discussed the risks of infection, bleeding, tissue injury, clip
migration, and inadequate sampling. Informed, written consent was
given. The usual time out protocol was performed immediately prior
to the procedure.

Using sterile technique, 1% Lidocaine, MRI guidance, and a 9 gauge
vacuum assisted device, biopsy was performed of the small mass in
the lateral right breast, central depth, using a lateral approach.
At the conclusion of the procedure, a barbell shaped tissue marker
clip was deployed into the biopsy cavity. Follow-up 2-view mammogram
was performed and dictated separately.
IMPRESSION: MRI guided biopsy of a small right breast mass. No apparent
complications.

ADDENDUM:
Pathology revealed FIBROCYSTIC CHANGE WITH USUAL DUCTAL HYPERPLASIA-
SMALL COMPLEX SCLEROSING LESION, 0.3 CM

- NEGATIVE FOR CARCINOMA of the RIGHT breast, lower outer quadrant,
central depth (barbell clip). This was found to be concordant by Dr.
OLYVIA, with surgical consultation for consideration of
excision recommended due to high risk.

Pathology results were discussed with the patient by telephone. The
patient reported doing well after the biopsy with tenderness at the
site. Post biopsy instructions and recommendations were reviewed and
questions were answered. The patient was encouraged to call The

Recommendation: Surgical consultation for consideration of excision.
If not excised, breast MRI recommended in 6 months. Patient wishes
to discuss results with provider and if desired, surgical
consultation will be arranged.

Pathology results reported by OLYVIA RN on [DATE].

*** End of Addendum ***
FINDINGS: I met with the patient, and we discussed the procedure of MRI guided
biopsy, including risks, benefits, and alternatives. Specifically,
we discussed the risks of infection, bleeding, tissue injury, clip
migration, and inadequate sampling. Informed, written consent was
given. The usual time out protocol was performed immediately prior
to the procedure.

Using sterile technique, 1% Lidocaine, MRI guidance, and a 9 gauge
vacuum assisted device, biopsy was performed of the small mass in
the lateral right breast, central depth, using a lateral approach.
At the conclusion of the procedure, a barbell shaped tissue marker
clip was deployed into the biopsy cavity. Follow-up 2-view mammogram
was performed and dictated separately.
IMPRESSION: MRI guided biopsy of a small right breast mass. No apparent
complications.

## 2022-04-05 ENCOUNTER — Other Ambulatory Visit: Payer: Self-pay | Admitting: *Deleted

## 2022-04-05 ENCOUNTER — Encounter: Payer: Self-pay | Admitting: Hematology and Oncology

## 2022-04-05 DIAGNOSIS — Z1501 Genetic susceptibility to malignant neoplasm of breast: Secondary | ICD-10-CM

## 2022-04-09 ENCOUNTER — Other Ambulatory Visit: Payer: Self-pay | Admitting: Obstetrics and Gynecology

## 2022-04-09 DIAGNOSIS — Z1231 Encounter for screening mammogram for malignant neoplasm of breast: Secondary | ICD-10-CM

## 2022-09-26 ENCOUNTER — Ambulatory Visit
Admission: RE | Admit: 2022-09-26 | Discharge: 2022-09-26 | Disposition: A | Discharge status: Home / Self Care | Payer: Managed Care, Other (non HMO) | Source: Ambulatory Visit | Attending: Hematology and Oncology | Admitting: Hematology and Oncology

## 2022-09-26 DIAGNOSIS — Z1501 Genetic susceptibility to malignant neoplasm of breast: Secondary | ICD-10-CM

## 2022-09-26 MED ORDER — GADOBUTROL 1 MMOL/ML IV SOLN
10.0000 mL | Freq: Once | INTRAVENOUS | Status: AC | PRN
  Administered 2022-09-26: 10 mL via INTRAVENOUS

## 2022-09-27 ENCOUNTER — Ambulatory Visit: Payer: Managed Care, Other (non HMO) | Admitting: Hematology and Oncology

## 2022-09-27 NOTE — Progress Notes (Signed)
51 y.o. G0P0 Married White or Caucasian Not Hispanic or Latino female here for annual exam.  Not sexually active, they are fine with it.    H/O TLH/BSO for BRCA 2 mutation in 11/20. Benign pathology. Tolerable vasomotor symptoms. No c/o vaginal dryness.  She is seen by Oncology yearly.   No bowel or bladder c/o.     Patient's last menstrual period was 09/24/2019 (exact date).          Sexually active: No.  The current method of family planning is status post hysterectomy.    Exercising: Yes.     Patient does farm work. Smoker:  no  Health Maintenance: Pap:  09/08/2019 WNL HR HPV Neg;  08-30-17 neg HPV HR neg  History of abnormal Pap:  Yes h/o CIN I in 2016, f/u after that was normal.  MMG:see epic. Mammogram next month. MRI in 4/23 and on 09/27/22 BMD:   none  Colonoscopy: 03/14/22, f/u 10 years.  TDaP:  10/06/20  Gardasil: na   reports that she has never smoked. She has never used smokeless tobacco. She reports that she does not drink alcohol and does not use drugs. 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 She and her husband live on a farm. Has goats and chickens.   Past Medical History:  Diagnosis Date   Asthma    per pt triggers are laughing, cold drink, exercise   Cold induced bronchospasm    Family history of BRCA2 gene positive    Family history of breast cancer    Family history of ovarian cancer    History of abnormal cervical Pap smear 07/2015   Inflammatory arthritis    RIGHT hand   Insomnia    Restless legs syndrome (RLS)     Past Surgical History:  Procedure Laterality Date   BREAST BIOPSY Right 04/04/2022   CYSTOSCOPY N/A 10/27/2019   Procedure: CYSTOSCOPY;  Surgeon: Salvadore Dom, MD;  Location: [facility];  Service: Gynecology;  Laterality: N/A;   CYSTOSCOPY W/ RETROGRADES Bilateral 12/16/2019   Procedure: CYSTOSCOPY WITH RETROGRADE PYELOGRAM;  Surgeon: Alexis Frock, MD;  Location: [facility];   Service: Urology;  Laterality: Bilateral;   CYSTOSCOPY WITH BIOPSY N/A 12/16/2019   Procedure: CYSTOSCOPY WITH BIOPSY;  Surgeon: Alexis Frock, MD;  Location: [facility];  Service: Urology;  Laterality: N/A;  45 MINS   TOTAL LAPAROSCOPIC HYSTERECTOMY WITH BILATERAL SALPINGO OOPHORECTOMY Bilateral 10/27/2019   Procedure: TOTAL LAPAROSCOPIC HYSTERECTOMY WITH BILATERAL SALPINGO OOPHORECTOMY;  Surgeon: Salvadore Dom, MD;  Location: [facility];  Service: Gynecology;  Laterality: Bilateral;  Extended recovery bed needed   WISDOM TOOTH EXTRACTION      Current Outpatient Medications  Medication Sig Dispense Refill   albuterol (PROVENTIL HFA;VENTOLIN HFA) 108 (90 Base) MCG/ACT inhaler Inhale 2 puffs, 15 minutes before physical activity 1 Inhaler 2   gabapentin (NEURONTIN) 100 MG capsule Take 100 mg by mouth at bedtime.     No current facility-administered medications for this visit.    Family History  Problem Relation Age of Onset   Ovarian cancer Mother        stage 45   Cancer Mother        stomach wall   BRCA 1/2 Mother        BRCA2 +   Asthma Father    Cerebral palsy Sister    Asthma Maternal Aunt    COPD Paternal Aunt    Asthma  Paternal Aunt    Asthma Paternal Aunt    Breast cancer Maternal Grandmother        dx younger than 28   Cancer Maternal Grandfather        lung cancer   Asthma Paternal Grandfather    Breast cancer Other        d. 52   Colon polyps Neg Hx    Colon cancer Neg Hx    Esophageal cancer Neg Hx    Stomach cancer Neg Hx    Rectal cancer Neg Hx     Review of Systems  All other systems reviewed and are negative.   Exam:   BP 138/82   Pulse 66   Ht _0  (1.702 m)   Wt 189 lb (85.7 kg)   LMP 09/24/2019 (Exact Date)   SpO2 100%   BMI 29.60 kg/m   Weight change: _1 @ Height:   Height: _2  (170.2 cm)  Ht Readings from Last 3 Encounters:  10/03/22 _3  (1.702 m)  03/14/22 _4  (1.727 m)  02/28/22  _5  (1.727 m)  Repeat BP 134/80  General appearance: alert, cooperative and appears stated age Head: Normocephalic, without obvious abnormality, atraumatic Neck: no adenopathy, supple, symmetrical, trachea midline and thyroid normal to inspection and palpation Lungs: clear to auscultation bilaterally Cardiovascular: regular rate and rhythm Breasts: normal appearance, no masses or tenderness Abdomen: soft, non-tender; non distended,  no masses,  no organomegaly Extremities: extremities normal, atraumatic, no cyanosis or edema Skin: Skin color, texture, turgor normal. No rashes or lesions Lymph nodes: Cervical, supraclavicular, and axillary nodes normal. No abnormal inguinal nodes palpated Neurologic: Grossly normal   Pelvic: External genitalia:  no lesions              Urethra:  normal appearing urethra with no masses, tenderness or lesions              Bartholins and Skenes: normal                 Vagina: normal appearing vagina with normal color and discharge, no lesions              Cervix: absent               Bimanual Exam:  Uterus:  uterus absent              Adnexa: no mass, fullness, tenderness               Rectovaginal: Confirms               Anus:  normal sphincter tone, no lesions  Gae Dry, CMA chaperoned for the exam.  1. Well woman exam Discussed breast self exam Discussed calcium and vit D intake Labs with primary No pap this year  2. BRCA2 gene mutation positive S/P TLH/BSO - CA 125 secondary to risk of peritoneal cancer.  3. Status post laparoscopic hysterectomy  4. History of bilateral salpingo-oophorectomy (BSO)

## 2022-10-03 ENCOUNTER — Ambulatory Visit (INDEPENDENT_AMBULATORY_CARE_PROVIDER_SITE_OTHER): Payer: Managed Care, Other (non HMO) | Admitting: Obstetrics and Gynecology

## 2022-10-03 ENCOUNTER — Encounter: Payer: Self-pay | Admitting: Obstetrics and Gynecology

## 2022-10-03 ENCOUNTER — Ambulatory Visit: Payer: Managed Care, Other (non HMO)

## 2022-10-03 VITALS — BP 138/82 | HR 66 | Ht 67.0 in | Wt 189.0 lb

## 2022-10-03 DIAGNOSIS — Z90710 Acquired absence of both cervix and uterus: Secondary | ICD-10-CM

## 2022-10-03 DIAGNOSIS — Z1501 Genetic susceptibility to malignant neoplasm of breast: Secondary | ICD-10-CM

## 2022-10-03 DIAGNOSIS — Z90722 Acquired absence of ovaries, bilateral: Secondary | ICD-10-CM

## 2022-10-03 DIAGNOSIS — Z9079 Acquired absence of other genital organ(s): Secondary | ICD-10-CM

## 2022-10-03 DIAGNOSIS — Z01419 Encounter for gynecological examination (general) (routine) without abnormal findings: Secondary | ICD-10-CM | POA: Diagnosis not present

## 2022-10-03 DIAGNOSIS — Z15068 Genetic susceptibility to other malignant neoplasm of digestive system: Secondary | ICD-10-CM

## 2022-10-03 NOTE — Patient Instructions (Signed)

## 2022-10-04 LAB — CA 125: CA 125: 9 U/mL (ref <35)

## 2022-10-05 NOTE — Progress Notes (Unsigned)
HEMATOLOGY-ONCOLOGY TELEPHONE VISIT PROGRESS NOTE  I connected with our patient on 10/08/22 at 10:30 AM EDT by telephone and verified that I am speaking with the correct person using two identifiers.  I discussed the limitations, risks, security and privacy concerns of performing an evaluation and management service by telephone and the availability of in person appointments.  I also discussed with the patient that there may be a patient responsible charge related to this service. The patient expressed understanding and agreed to proceed.   History of Present Illness:  Jasmin Duran is a 51 y.o. with above-mentioned history of high risk for breast cancer and BRCA2 mutation positive. Breast MRI on 03/15/2021 and a 72-monthfollow-up breast MRI in October revealed no evidence of malignancy. She presents to the clinic via phone.    REVIEW OF SYSTEMS:   Constitutional: Denies fevers, chills or abnormal weight loss All other systems were reviewed with the patient and are negative. Observations/Objective:     Assessment Plan:  BRCA2 gene mutation positive Average to risk of cancer by age of 730 (Antoniou et al pooled pedigree data from 235studies of 8139 index patients with breast or ovarian cancer) Breast cancer risk: 45 percent (95% CI, 33 to 54 percent) Ovarian cancer risk: 11 percent (95% CI, 4.1 to 18 percent)   Other cancer risks:  1. Pancreatic cancer (relative risk is 3.51) with incidence of 4.9% in BRCA2 carriers 2. Colorectal cancers: In many studies there was no increased risk But there may be some for BRCA1 carriers 3. Melanoma and other skin cancers: The risk of oatmeal melanoma is increase in BRCA2 mutation carriers but it still extremely rare. ------------------------------------------------------------------------------------------------------------------ Breast cancer surveillance: 1.  Breast exam  10/08/2022: Benign (done by her primary care) 2. Mammogram  scheduled for  10/31/2022 3.  Breast MRI  09/26/2022: Unremarkable, breast density category B. this was a 632-monthollow-up to her prior breast MRI done in March. Breast density category B  I recommended that we do the next breast MRI in March 2025. We will order this will be due a telephone visit in 1 year   The patient underwent bilateral salpingo-oophorectomy and hysterectomy November 2020 for ovarian cancer risk reduction. Return to clinic with the phone visit in 1 year   I discussed the assessment and treatment plan with the patient. The patient was provided an opportunity to ask questions and all were answered. The patient agreed with the plan and demonstrated an understanding of the instructions. The patient was advised to call back or seek an in-person evaluation if the symptoms worsen or if the condition fails to improve as anticipated.   I provided 12 minutes of non-face-to-face time during this encounter.  This includes time for charting and coordination of care   ViHarriette OharaMD  I DeGardiner Coinsm scribing for Dr. GuLindi AdieI have reviewed the above documentation for accuracy and completeness, and I agree with the above.

## 2022-10-08 ENCOUNTER — Inpatient Hospital Stay: Payer: Managed Care, Other (non HMO) | Attending: Hematology and Oncology | Admitting: Hematology and Oncology

## 2022-10-08 DIAGNOSIS — Z1509 Genetic susceptibility to other malignant neoplasm: Secondary | ICD-10-CM | POA: Diagnosis not present

## 2022-10-08 DIAGNOSIS — Z1501 Genetic susceptibility to malignant neoplasm of breast: Secondary | ICD-10-CM

## 2022-10-08 NOTE — Assessment & Plan Note (Signed)
Average to risk of cancer by age of 37: (Antoniou et al pooled pedigree data from 62 studies of 8139 index patients with breast or ovarian cancer) Breast cancer risk: 45 percent (95% CI, 33 to 54 percent) Ovarian cancer risk: 11 percent (95% CI, 4.1 to 18 percent)  Other cancer risks:  1. Pancreatic cancer (relative risk is 3.51) with incidence of 4.9% in BRCA2 carriers 2. Colorectal cancers: In many studies there was no increased risk But there may be some for BRCA1 carriers 3. Melanoma and other skin cancers: The risk of oatmeal melanoma is increase in BRCA2 mutation carriers but it still extremely rare. ------------------------------------------------------------------------------------------------------------------ Breast cancer surveillance: 1.Breast exam  10/08/2022: Benign (done by her primary care) 2.Mammogram  scheduled for 10/31/2022 3.Breast MRI  09/26/2022: Unremarkable, breast density category B Breast density category B  She discontinued her medications for rheumatoid arthritis which were Plaquenil and prednisone.  She is doing quite well without any major joint symptoms.  She feels little stiff in the morning but that gets better with activity.  The patient underwent bilateral salpingo-oophorectomy and hysterectomy November 2020 for ovarian cancer risk reduction. Return to clinic in 1 year for follow-up

## 2022-10-31 ENCOUNTER — Ambulatory Visit
Admission: RE | Admit: 2022-10-31 | Discharge: 2022-10-31 | Disposition: A | Discharge status: Home / Self Care | Payer: Managed Care, Other (non HMO) | Source: Ambulatory Visit | Attending: Obstetrics and Gynecology | Admitting: Obstetrics and Gynecology

## 2022-10-31 DIAGNOSIS — Z1231 Encounter for screening mammogram for malignant neoplasm of breast: Secondary | ICD-10-CM

## 2023-06-05 ENCOUNTER — Other Ambulatory Visit: Payer: Self-pay | Admitting: Obstetrics and Gynecology

## 2023-06-05 DIAGNOSIS — Z1231 Encounter for screening mammogram for malignant neoplasm of breast: Secondary | ICD-10-CM

## 2023-11-06 ENCOUNTER — Ambulatory Visit: Payer: Managed Care, Other (non HMO)
